# Patient Record
Sex: Male | Born: 1955 | Race: Black or African American | Hispanic: No | Marital: Married | Smoking: Former smoker
Health system: Southern US, Community
[De-identification: ages and names within clinical notes are randomized; demographics above are authoritative.]

## PROBLEM LIST (undated history)

## (undated) DIAGNOSIS — I209 Angina pectoris, unspecified: Secondary | ICD-10-CM

## (undated) DIAGNOSIS — F329 Major depressive disorder, single episode, unspecified: Secondary | ICD-10-CM

## (undated) DIAGNOSIS — I10 Essential (primary) hypertension: Secondary | ICD-10-CM

## (undated) DIAGNOSIS — F32A Depression, unspecified: Secondary | ICD-10-CM

## (undated) DIAGNOSIS — I509 Heart failure, unspecified: Secondary | ICD-10-CM

## (undated) DIAGNOSIS — I219 Acute myocardial infarction, unspecified: Secondary | ICD-10-CM

## (undated) DIAGNOSIS — I2510 Atherosclerotic heart disease of native coronary artery without angina pectoris: Secondary | ICD-10-CM

## (undated) HISTORY — DX: Heart failure, unspecified: I50.9

## (undated) HISTORY — PX: CORONARY ARTERY BYPASS GRAFT: SHX141

---

## 2011-12-12 ENCOUNTER — Inpatient Hospital Stay: Payer: Self-pay | Admitting: Internal Medicine

## 2011-12-12 DIAGNOSIS — Z9889 Other specified postprocedural states: Secondary | ICD-10-CM | POA: Insufficient documentation

## 2011-12-12 HISTORY — DX: Other specified postprocedural states: Z98.890

## 2011-12-12 LAB — CBC
HCT: 40.9 % (ref 40.0–52.0)
HCT: 42.3 % (ref 40.0–52.0)
MCH: 32.4 pg (ref 26.0–34.0)
MCHC: 33.4 g/dL (ref 32.0–36.0)
MCHC: 33.6 g/dL (ref 32.0–36.0)
MCV: 97 fL (ref 80–100)
Platelet: 270 10*3/uL (ref 150–440)
RBC: 4.21 10*6/uL — ABNORMAL LOW (ref 4.40–5.90)
RDW: 12.7 % (ref 11.5–14.5)
WBC: 7.3 10*3/uL (ref 3.8–10.6)

## 2011-12-12 LAB — TROPONIN I
Troponin-I: 0.53 ng/mL — ABNORMAL HIGH
Troponin-I: 1.6 ng/mL — ABNORMAL HIGH
Troponin-I: 0.5 ng/mL — ABNORMAL HIGH

## 2011-12-12 LAB — BASIC METABOLIC PANEL
Anion Gap: 4 — ABNORMAL LOW (ref 7–16)
Calcium, Total: 9.1 mg/dL (ref 8.5–10.1)
Co2: 29 mmol/L (ref 21–32)
Creatinine: 1.14 mg/dL (ref 0.60–1.30)
EGFR (Non-African Amer.): >60
Osmolality: 276 (ref 275–301)
Potassium: 4.2 mmol/L (ref 3.5–5.1)
Sodium: 139 mmol/L (ref 136–145)

## 2011-12-12 LAB — COMPREHENSIVE METABOLIC PANEL
Albumin: 3.7 g/dL (ref 3.4–5.0)
Alkaline Phosphatase: 71 U/L (ref 50–136)
Bilirubin,Total: 0.7 mg/dL (ref 0.2–1.0)
Calcium, Total: 9 mg/dL (ref 8.5–10.1)
Creatinine: 1.15 mg/dL (ref 0.60–1.30)
Glucose: 113 mg/dL — ABNORMAL HIGH (ref 65–99)
Osmolality: 284 (ref 275–301)
Potassium: 3.9 mmol/L (ref 3.5–5.1)
SGOT(AST): 31 U/L (ref 15–37)
Sodium: 142 mmol/L (ref 136–145)
Total Protein: 7.5 g/dL (ref 6.4–8.2)

## 2011-12-12 LAB — CK TOTAL AND CKMB (NOT AT ARMC)
CK, Total: 299 U/L — ABNORMAL HIGH (ref 35–232)
CK-MB: 4.7 ng/mL — ABNORMAL HIGH (ref 0.5–3.6)

## 2011-12-12 LAB — PROTIME-INR: Prothrombin Time: 13.4 secs (ref 11.5–14.7)

## 2011-12-12 LAB — APTT: Activated PTT: 36 secs — ABNORMAL HIGH (ref 23.6–35.9)

## 2011-12-13 LAB — LIPID PANEL
Ldl Cholesterol, Calc: 104 mg/dL — ABNORMAL HIGH (ref 0–100)
Triglycerides: 115 mg/dL (ref 0–200)
VLDL Cholesterol, Calc: 23 mg/dL (ref 5–40)

## 2011-12-21 ENCOUNTER — Inpatient Hospital Stay: Payer: Self-pay | Admitting: Internal Medicine

## 2011-12-21 LAB — BASIC METABOLIC PANEL
Anion Gap: 8 (ref 7–16)
BUN: 14 mg/dL (ref 7–18)
Calcium, Total: 8.6 mg/dL (ref 8.5–10.1)
Chloride: 107 mmol/L (ref 98–107)
Co2: 25 mmol/L (ref 21–32)
Creatinine: 1.05 mg/dL (ref 0.60–1.30)
EGFR (African American): >60
EGFR (Non-African Amer.): >60
Osmolality: 280 (ref 275–301)
Sodium: 140 mmol/L (ref 136–145)

## 2011-12-21 LAB — PROTIME-INR
INR: 1
Prothrombin Time: 13.4 secs (ref 11.5–14.7)

## 2011-12-21 LAB — CBC
MCH: 33.4 pg (ref 26.0–34.0)
MCV: 97 fL (ref 80–100)
Platelet: 325 10*3/uL (ref 150–440)
RBC: 3.33 10*6/uL — ABNORMAL LOW (ref 4.40–5.90)
RDW: 13.2 % (ref 11.5–14.5)
WBC: 7.9 10*3/uL (ref 3.8–10.6)

## 2011-12-21 LAB — CK TOTAL AND CKMB (NOT AT ARMC)
CK, Total: 242 U/L — ABNORMAL HIGH (ref 35–232)
CK-MB: 2 ng/mL (ref 0.5–3.6)

## 2011-12-21 LAB — TROPONIN I: Troponin-I: 0.48 ng/mL — ABNORMAL HIGH

## 2011-12-25 DIAGNOSIS — Z951 Presence of aortocoronary bypass graft: Secondary | ICD-10-CM | POA: Insufficient documentation

## 2011-12-25 HISTORY — DX: Presence of aortocoronary bypass graft: Z95.1

## 2013-02-18 IMAGING — CR DG CHEST 1V PORT
1 series · 1 of 1 positions shown · non-contrast
Comparison: none

REASON FOR EXAM: Chest Pain
COMMENTS:

[ap]
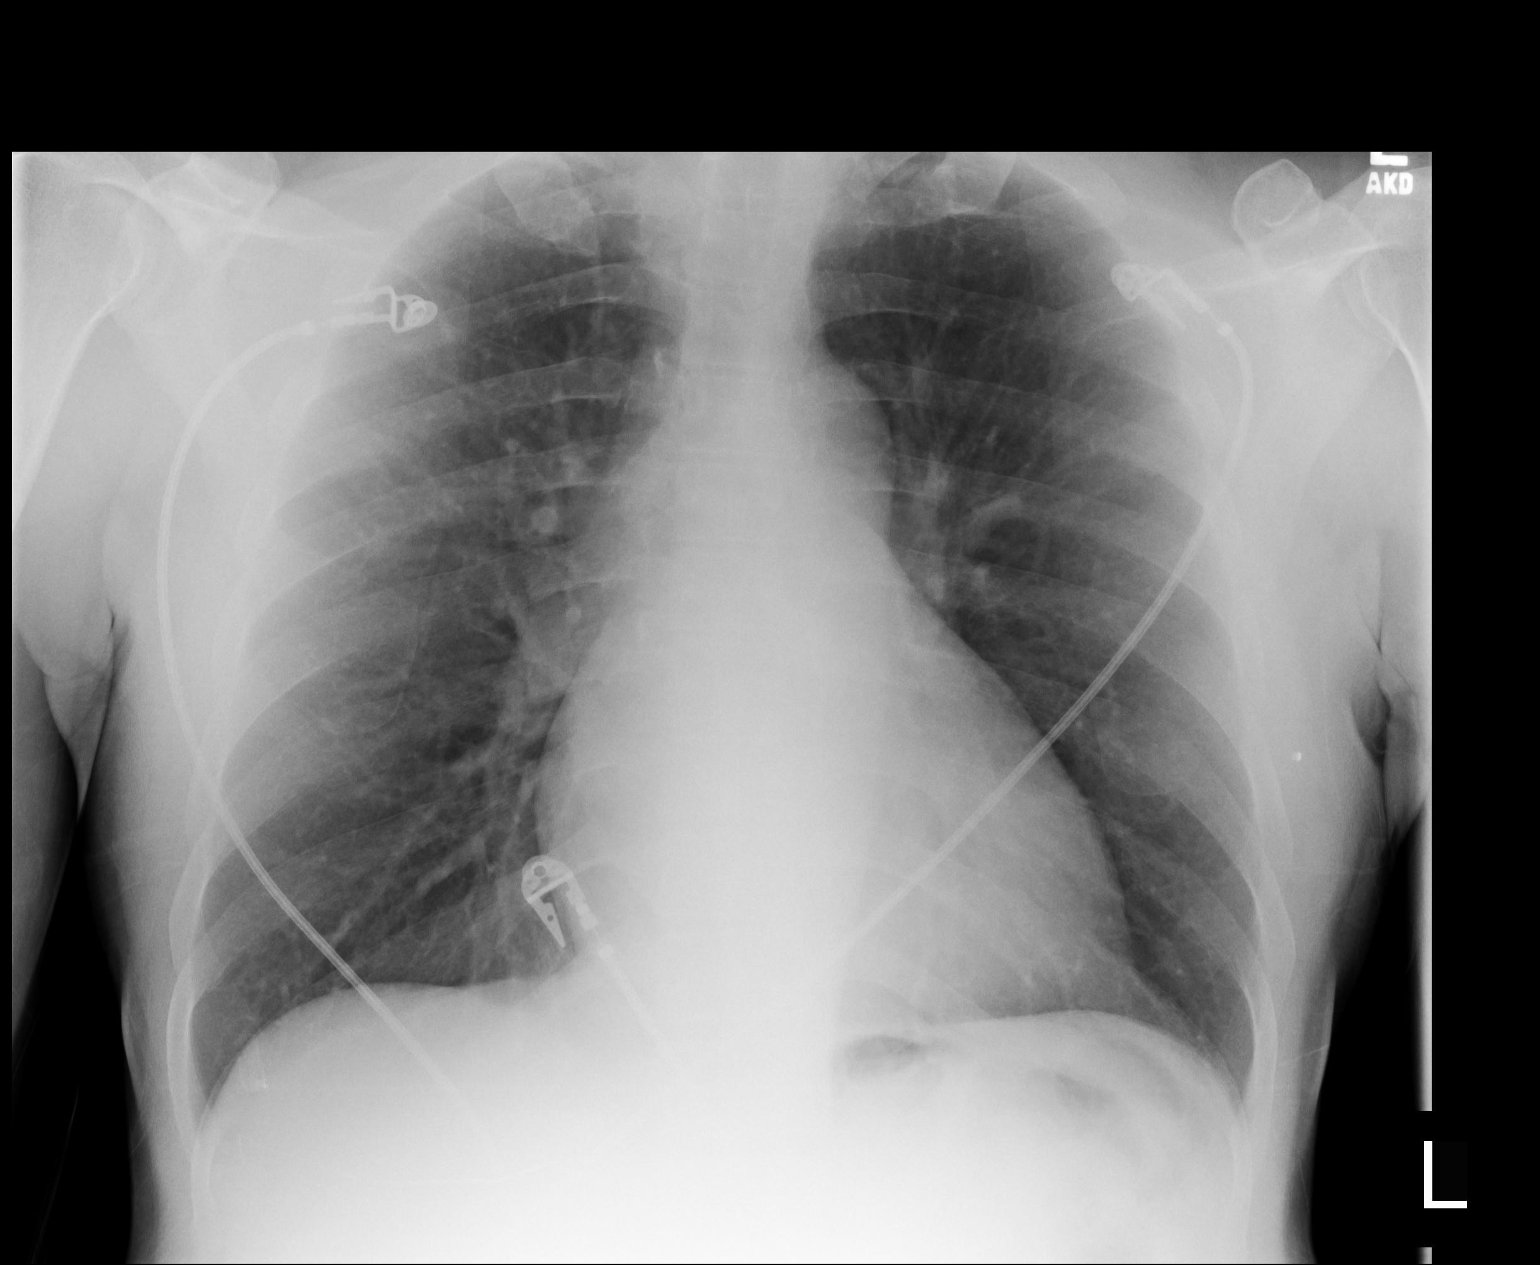

[1 of 1 positions shown; findings below may reference images not displayed]

PROCEDURE:     DXR - DXR PORTABLE CHEST SINGLE VIEW  - December 21, 2011  [DATE]

RESULT:     Comparison is made to the study 12 December, 2011.

The cardiac silhouette is mildly enlarged. There is mild hyperinflation of
the lungs. Cardiac monitoring electrodes are present. There is no edema,
infiltrate, effusion or mass. No pneumothorax is evident.
IMPRESSION: 1. Lordotic projection. No acute cardiopulmonary disease appreciated.

[REDACTED]

## 2013-11-05 DIAGNOSIS — I219 Acute myocardial infarction, unspecified: Secondary | ICD-10-CM

## 2013-11-05 DIAGNOSIS — E785 Hyperlipidemia, unspecified: Secondary | ICD-10-CM | POA: Insufficient documentation

## 2013-11-05 HISTORY — DX: Acute myocardial infarction, unspecified: I21.9

## 2014-02-23 ENCOUNTER — Ambulatory Visit: Payer: Self-pay | Admitting: Urology

## 2014-04-20 ENCOUNTER — Ambulatory Visit: Payer: Self-pay | Admitting: Urology

## 2014-04-20 DIAGNOSIS — I25810 Atherosclerosis of coronary artery bypass graft(s) without angina pectoris: Secondary | ICD-10-CM

## 2014-04-20 LAB — BASIC METABOLIC PANEL
ANION GAP: 2 — AB (ref 7–16)
BUN: 12 mg/dL (ref 7–18)
CALCIUM: 9.1 mg/dL (ref 8.5–10.1)
CO2: 33 mmol/L — AB (ref 21–32)
Chloride: 104 mmol/L (ref 98–107)
Creatinine: 1.26 mg/dL (ref 0.60–1.30)
EGFR (African American): >60
EGFR (Non-African Amer.): >60
Glucose: 82 mg/dL (ref 65–99)
Osmolality: 276 (ref 275–301)
POTASSIUM: 4.6 mmol/L (ref 3.5–5.1)
Sodium: 139 mmol/L (ref 136–145)

## 2014-04-20 LAB — CBC
HCT: 41.9 % (ref 40.0–52.0)
HGB: 13.8 g/dL (ref 13.0–18.0)
MCH: 31.8 pg (ref 26.0–34.0)
MCHC: 32.8 g/dL (ref 32.0–36.0)
MCV: 97 fL (ref 80–100)
Platelet: 282 10*3/uL (ref 150–440)
RBC: 4.32 10*6/uL — AB (ref 4.40–5.90)
RDW: 13.3 % (ref 11.5–14.5)
WBC: 5.3 10*3/uL (ref 3.8–10.6)

## 2014-05-05 ENCOUNTER — Ambulatory Visit: Payer: Self-pay | Admitting: Urology

## 2014-12-02 NOTE — Op Note (Signed)
PATIENT NAME:  Ryan Stafford, Ryan Stafford MR#:  [id number] DATE OF BIRTH:  1956-06-16  DATE OF PROCEDURE:  05/05/2014  PREOPERATIVE DIAGNOSIS: Right hydrocele.   POSTOPERATIVE DIAGNOSES:  1. Right hydrocele.  2. No left hydrocele.  3. Scrotal sebaceous cyst.  PROCEDURE: Right hydrocelectomy, excision of a sebaceous cyst of the scrotum.   ANESTHESIA: General.  COMPLICATIONS: None.  DESCRIPTION OF PROCEDURE: With the patient sterilely prepped and draped in the supine position for ease of approach to the external genitalia and under good relaxation from a general anesthetic, we did a timeout. The timeout was agreed to. Examination revealed almost no left hydrocele at all and I do not really want to excise the left tunica vaginalis. I excised the right tunica vaginalis through a midline incision after placing 3 to 4 mL of lidocaine and Marcaine solution into the incision site at the median raphe. I carried the incision down through the tunica vaginalis with electrocautery, so there was no bleeding. Then, the hydrocele was opened, incised and marsupialized on itself and sutured with a running 3-0 Vicryl. The skin was closed with a running 3-0 Vicryl. This was subcuticular continuous with the tunica vaginalis closure. No drain was utilized as there was minimal bleeding. Once I closed the skin, I excised a small sebaceous cyst on the right hemiscrotum utilizing an incision over the cyst, going down to the capsule and cutting the capsule away, suturing any bleeding at the base of the sebaceous cyst. Then, the skin was closed with a 3-0 Vicryl. The 3-0 Vicryl was also used on the base of the sebaceous cyst. The capsule was included in the excision. Sterile dressings were placed. He was sent to recovery in satisfactory condition with Dermabond over the wound site.      ____________________________ Caralyn Guileichard D. Edwyna ShellHart, DO rdh:TT D: 05/05/2014 16:10:31 ET T: 05/05/2014 18:00:29 ET JOB#: [id number]  cc: Caralyn Guileichard D. Edwyna ShellHart,  DO, <Dictator> RICHARD D HART DO ELECTRONICALLY SIGNED 06/02/2014 14:08

## 2014-12-03 NOTE — H&P (Signed)
PATIENT NAME:  Ryan Stafford, Ryan Stafford MR#:  [id number] DATE OF BIRTH:  [birth year]  DATE OF ADMISSION:  12/21/2011  ADDENDUM  ALLERGIES: None.   MEDICATIONS:  1. Vicodin 5 mg/500 mg p.o. t.i.d. p.r.n.  2. Diovan 80 mg p.o. daily.  3. Toprol-XL 50 mg p.o. once daily.  4. Aspirin 325 mg p.o. daily.  5. Imdur 30 mg daily.   6. Lipitor 40 mg p.o. daily.   ASSESSMENT AND PLAN: For tobacco use, patient was counseled for smoking cessation.   ____________________________ Ryan PollackQing Curt Oatis, MD qc:cms D: 12/21/2011 12:01:38 ET T: 12/21/2011 13:06:30 ET JOB#: [id number]  cc: Ryan PollackQing Geanine Vandekamp, MD, <Dictator> Ryan PollackQING Lajuan Godbee MD ELECTRONICALLY SIGNED 12/21/2011 16:50

## 2014-12-03 NOTE — H&P (Signed)
PATIENT NAME:  Ryan Stafford, Ryan Stafford MR#:  [id number] DATE OF BIRTH:  May 09, 1956  DATE OF ADMISSION:  12/21/2011  CHIEF COMPLAINT: Chest pain.   HISTORY OF PRESENT ILLNESS: The patient is a 59 year old African American male with a history of hypertension, coronary artery disease, recently diagnosed myocardial infarction, and hyperlipidemia who presented to the ED with chest pain which happened last night and early this morning. The patient denies any radiation or diaphoresis. No shortness of breath, orthopnea, or nocturnal dyspnea. No leg edema. No chest pain now. The patient denies any other symptoms. He was noted to have elevated troponin at 0.48 and was treated with Lovenox and aspirin.  Actually the patient was just discharged a few days ago for non-STEMI.  During the last hospitalization cardiologist Dr. Darrold Junker did a cardiac catheterization which showed multiple vessel stenoses. He recommended medical management and followup with him as an outpatient.  The patient may need surgery.   PAST MEDICAL HISTORY:  1. Hypertension.  2. Hyperlipidemia.  3. Coronary artery disease. 4. Myocardial infarction.   SOCIAL HISTORY:  Chronic smoker of one pack a day since the age of 25. No alcohol or drug abuse.   FAMILY HISTORY: Negative for coronary artery disease.  PAST SURGICAL HISTORY: None.  REVIEW OF SYSTEMS: CONSTITUTIONAL: The patient denies any fever or chills. No headache or dizziness. No weight loss.  HEENT: No double vision, blurred vision, or glaucoma. No postnasal drip or epistaxis. No dysphagia or slurred speech. RESPIRATORY: No cough, sputum, shortness of breath, or hemoptysis.  CARDIOVASCULAR: Positive for chest pain, but no orthopnea, nocturnal dyspnea, or leg edema. No palpitations. GI: No abdominal pain, nausea, vomiting, or diarrhea. No melena. No bloody stool. GU: No dysuria, hematuria, or incontinence.  ENDOCRINE:  No polydipsia or polyuria. HEME: No easy bruising or bleeding. PSYCHIATRIC:  No depression or anxiety. NEUROLOGY: No syncope, loss of consciousness, or seizure. MUSCULOSKELETAL: No joint pain or edema. SKIN: No rash or jaundice.   PHYSICAL EXAMINATION:  VITAL SIGNS: Temperature 98.8, blood pressure 135/79, blood pressure was 175/95. Pulse 71, oxygen saturation 98% on room air.   GENERAL: The patient is alert, awake, oriented, in no acute distress.   HEENT: Pupils round, equal, reactive to light and accommodation. Moist oral mucosa. Clear oropharynx.   NECK: Supple. No JVD or carotid bruit. No lymphadenopathy. No thyromegaly.   CARDIOVASCULAR: S1, S2, regular rate and rhythm. No murmurs or gallops.   PULMONARY: Bilateral air entry. No wheezing or rales.   ABDOMEN: Soft. No distention. No tenderness. No organomegaly. Bowel sounds present.   EXTREMITIES: No edema, clubbing, or cyanosis. No calf tenderness. Strong bilateral pedal pulses.   SKIN: No rash or jaundice.   NEUROLOGY: Alert and oriented times three. No focal deficits. Power five out of five. Sensation intact. Deep tendon reflexes 2+.   LABS/STUDIES: Troponin 0.48, WBC 7.9, hemoglobin 11.1, platelets 325. Electrolytes are normal. Glucose 102, BUN 14, creatinine 1.05, CK 242, CK-MB 2, INR 1.  Chest x-ray: No acute cardiopulmonary disease. EKG shows sinus bradycardia at 55 beats per minute with possible left ventricular hypertrophy.  IMPRESSION: 1. Non-STEMI. 2. Coronary artery disease with multivessel stenosis.  3. Hypertension.  4. Hyperlipidemia.  5. Anemia.  6. Tobacco use.   PLAN OF TREATMENT:  1. The patient will be admitted to the telemetry floor. We will start O2 by nasal cannula. Continue telemetry monitoring. Follow up troponin level.  2. We will continue aspirin 325 mg p.o. daily, nitroglycerin and morphine p.r.n., give Lovenox 85 mg subcutaneous  q.12 hours and follow up with cardiology, Dr. Darrold JunkerParaschos.   3. We will continue hypertension medications including Lopressor, Diovan, Imdur, and  also Lipitor.  4. GI prophylaxis.  5. I discussed the patient's situation and plan of treatment with the patient and patient's family member.   TIME SPENT: About 60 minutes.   ____________________________ Shaune PollackQing Shalee Paolo, MD qc:bjt D: 12/21/2011 11:54:00 ET T: 12/21/2011 13:51:40 ET JOB#: [id number]  cc: Shaune PollackQing Majestic Molony, MD, <Dictator> Shaune PollackQING Hubbard Seldon MD ELECTRONICALLY SIGNED 12/21/2011 16:50

## 2014-12-03 NOTE — H&P (Signed)
PATIENT NAME:  Ryan Stafford, Ryan Stafford MR#:  [id number] DATE OF BIRTH:  Feb 18, 1956  DATE OF ADMISSION:  12/12/2011  CHIEF COMPLAINT: Chest pain.   HISTORY OF PRESENT ILLNESS: Ryan Stafford is a 59 year old pleasant African American male with past medical history of systemic hypertension. The patient was in his usual state of health until about four days ago when he developed midsternal chest pain, that was on Tuesday, located at the midsternal area. The severity was 6 on a scale of 10. Lasted for about maximum five minutes then subsided. The pain recurred again last evening around 7:00 p.m. and then eased off to recur again at 11:00 p.m. This time was severe, reaching 9 to 10 on a scale of 10. Midsternal in location, described as heaviness-like feeling with some inside feeling of sticking sensation. This lasted about 20 minutes and he was on his way to the Emergency Department. Just a few minutes before arrival his chest pain started to ease off. He denied having any shortness of breath. No vomiting but he reported sweating. His chest pain right now is 0 on a scale of 10.   REVIEW OF SYSTEMS: CONSTITUTIONAL: Denies having any fever. No chills. No fatigue. EYES: No blurring of vision. No double vision. ENT: No hearing impairment. No sore throat. No dysphagia. CARDIOVASCULAR: Reports chest pain as above. No shortness of breath. No syncope. RESPIRATORY: No shortness of breath. No chest pain. No cough. No sputum production. GASTROINTESTINAL: No abdominal pain. No vomiting. No diarrhea. GENITOURINARY: No dysuria or frequency of urination. MUSCULOSKELETAL: No joint pain or swelling. No muscular pain or swelling. INTEGUMENTARY: No skin rash. No ulcers. NEUROLOGY: No focal weakness. No seizure activity. No headache. PSYCHIATRY: No anxiety. No depression. ENDOCRINE: No heat or cold intolerance. No polyuria or polydipsia.   PAST MEDICAL HISTORY:  1. Systemic hypertension. 2. Mild hypercholesterolemia.   SOCIAL HABITS:  Chronic smoker, 1 pack per day since age of 40. No history of alcoholism but he drinks maybe once a month a beer or liquor. No history of drug abuse.   FAMILY HISTORY: Negative for premature coronary artery disease.   SOCIAL HISTORY: He is married, living with his wife. He works as Merchandiser, retail at [employer].   ADMISSION MEDICATION: Diovan, the dose is not known.   ALLERGIES: No known drug allergies.   PHYSICAL EXAMINATION:  VITAL SIGNS: Blood pressure 181/109, respiratory rate 20, pulse 75, temperature 96.7, oxygen saturation 98%.   GENERAL APPEARANCE: Middle-aged male laying in bed in no acute distress.   HEAD AND NECK EXAMINATION: No pallor. No icterus. No cyanosis.   ENT: Hearing was normal. Nasal mucosa, lips, tongue were normal.   EYES: Normal eyelids and conjunctiva. Pupils about 4 mm, equal and reactive to light.   NECK: Supple. Trachea at midline. No thyromegaly. No cervical lymphadenopathy. No masses.   HEART: Normal S1, S2. No S3 or S4. No murmur. No gallop. No carotid bruits.   RESPIRATORY: Normal breathing pattern without use of accessory muscles. No rales. No wheezing. There were few scattered rhonchi.   ABDOMEN: Soft without tenderness. No hepatosplenomegaly. No masses. No hernias.   SKIN: No ulcers. No subcutaneous nodules.   MUSCULOSKELETAL: No joint swelling. No clubbing.   NEUROLOGIC: Cranial nerves II through XII are intact. No focal motor deficit.   PSYCHIATRIC: Patient is alert, oriented x3. Mood and affect were normal.   LABORATORY, DIAGNOSTIC, AND RADIOLOGICAL DATA: EKG showed normal sinus rhythm at rate of 68 per minute. EKG again showed normal sinus rhythm at rate  of 68 per minute. Mild elevation of ST segment in the anterior leads with biphasic T wave. Otherwise unremarkable EKG. Serum glucose 113, BUN 12, creatinine 1.15, sodium 142, potassium 3.9. Liver function tests were normal. Total CPK was elevated 299. Troponin was elevated at 0.53. CBC  showed white count 7000, hemoglobin 14, hematocrit 42, platelet count 270. Prothrombin time 13. INR 1. APTT 36.   ASSESSMENT:  1. Non-ST elevation acute myocardial infarction.  2. Severe systemic hypertension.  3. Mild hypercholesterolemia.  4. Tobacco abuse.   PLAN: Admit the patient to the Intensive Care Unit in consultation with cardiology. Aspirin initiated at 325 mg a day, beta blocker using metoprolol 25 mg twice a day. Continue Diovan 80 mg a day. Nitroglycerin patch and sublingual p.r.n. as well. Full anticoagulation with Lovenox using 85 mg subcutaneous twice a day. Check lipid profile in the morning. Follow up on cardiac enzymes. The patient is now comfortable and chest pain free. Patient is advised to quit smoking and I placed him on nicotine patch.   TIME SPENT EVALUATING THIS PATIENT: Took more than 55 minutes.   ____________________________ Ryan CornersAmir M. Rudene Rearwish, MD amd:cms D: 12/12/2011 02:37:26 ET T: 12/12/2011 07:17:13 ET JOB#: [id number]  cc: Ryan CornersAmir M. Rudene Rearwish, MD, <Dictator> Zollie ScaleAMIR M Lorraine Cimmino MD ELECTRONICALLY SIGNED 12/12/2011 22:12

## 2014-12-03 NOTE — Discharge Summary (Signed)
PATIENT NAME:  Ryan Stafford, Ryan Stafford MR#:  [id number] DATE OF BIRTH:  [birth year]  DATE OF ADMISSION:  12/21/2011 DATE OF DISCHARGE:  12/22/2011  PRIMARY CARE PHYSICIAN: Evelene CroonMeindert Niemeyer, MD  CONSULTANTS:  Marcina MillardAlexander Paraschos, MD - Cardiology.  DISCHARGE INSTRUCTIONS: [facility].   HISTORY OF PRESENT ILLNESS: The patient is 59 year old African American male with a history of hypertension, coronary artery disease, recently diagnosed myocardial infarction, and hyperlipidemia who presented to the ED with chest pain twice without radiation or diaphoresis. The patient was noted to have an elevated troponin and was treated with Lovenox and aspirin and admitted for non-STEMI. For a detailed history and physical examination, please refer to the admission note dictated by Dr. Imogene Burnhen.   LABS/STUDIES: On admission date troponin was 0.48. WBC was 7.9 and hemoglobin 11.1. BUN was 14 and creatinine 1.05. Electrolytes are normal. CK 242 and CK-MB 2. INR 1.   Chest x-ray: No acute cardiopulmonary disease.   EKG showed sinus bradycardia at 55 beats per minute with left ventricular hypertrophy.   HOSPITAL COURSE: After admission the patient has been treated with aspirin 325 mg p.o. daily and nitroglycerin p.r.n. In addition, the patient has been treated with Lovenox 1 mg/kg every 12 hours subcutaneous. According to nurse, cardiology, Dr. Darrold JunkerParaschos, evaluated the patient today and suggests the [facility] needs [facility]. The patient's blood pressure has been controlled with Lopressor and Imdur. The patient has had no complaints after admission.   Today his temperature is 97.6, blood pressure 142/79, pulse 55, and oxygen saturation 96% on room air. Physical examination is unremarkable. The patient is stable and [facility].    FINAL DIAGNOSES:  1. Non-ST-elevated myocardial infarction. 2. Coronary artery disease with multivessel  stenoses.  3. Hyperlipidemia.  4. Hypertension.  5. Tobacco use.  6. Anemia.   DISPOSITION: [facility]. I discussed the patient's transfer plan with the nurse and the patient.   TIME SPENT: Approximately 35 minutes. ____________________________ Shaune PollackQing Torra Pala, MD qc:slb D: 12/22/2011 14:18:37 ET T: 12/22/2011 14:46:24 ET JOB#: [id number]  cc: Shaune PollackQing Conley Delisle, MD, <Dictator> Meindert A. Lacie ScottsNiemeyer, MD Shaune PollackQING Otoniel Myhand MD ELECTRONICALLY SIGNED 12/22/2011 16:00

## 2014-12-03 NOTE — Consult Note (Signed)
PATIENT NAME:  Ryan Stafford, [name] MR#:  [id number] DATE OF BIRTH:  05-22-1956  DATE OF CONSULTATION:  12/22/2011  REFERRING PHYSICIAN:  Shaune PollackQing Chen, MD CONSULTING PHYSICIAN:  Marcina MillardAlexander Cicley Ganesh, MD  CHIEF COMPLAINT: Chest pain.   HISTORY OF PRESENT ILLNESS: The patient is a 59 year old gentleman referred for evaluation of chest pain and non-ST elevation myocardial infarction. The patient underwent cardiac catheterization last week which revealed 70% stenosis of the mid LAD, 80% stenosis of the distal LAD, 80% stenosis of the first diagonal branch, 75% stenosis of the proximal ramus intermedius branch, 80% stenosis of the first obtuse marginal branch, 75% stenosis of the third obtuse marginal branch, and 75% stenosis in the left posterolateral branch. Since the patient had diffuse disease, it was felt that initial medical therapy would be reasonable. The patient was discharged home only to experience recurrent chest pain and was readmitted on 12/21/2011. Troponin was elevated to 0.48. There were no diagnostic ECG changes.   PAST MEDICAL HISTORY:  1. Three-vessel coronary artery disease as described above.  2. Hypertension.  3. Hyperlipidemia.  4. Non-ST elevation myocardial infarction 12/11/2011.   MEDICATIONS:  1. Aspirin 325 mg daily.  2. Toprol-XL 50 mg daily.  3. Imdur 30 mg daily.  4. Valsartan 80 mg daily.  5. Lipitor 40 mg at bedtime. 6. Vicodin p.r.n.     SOCIAL HISTORY: The patient is married. He quit tobacco abuse two weeks ago.   FAMILY HISTORY: No immediate family history of coronary artery disease or myocardial infarction.    REVIEW OF SYSTEMS: CONSTITUTIONAL: No fever or chills. EYES: No blurry vision. EARS: No hearing loss. RESPIRATORY: No shortness of breath. CARDIOVASCULAR: Chest pain as described above. GASTROINTESTINAL: No nausea, vomiting, diarrhea or constipation. GU: No dysuria or hematuria. MUSCULOSKELETAL: No arthralgias or myalgias. NEUROLOGICAL: No focal muscle weakness  or numbness. PSYCHOLOGICAL: No depression or anxiety.   PHYSICAL EXAMINATION:  VITAL SIGNS: Blood pressure 142/79, pulse 55, respirations 18, temperature 97.9, pulse oximetry 96%.   HEENT: Pupils are equal and reactive to light and accommodation.   NECK: Supple without thyromegaly.   LUNGS: Clear.   HEART: Normal jugular venous pressure. Normal point of maximal impulse. Regular rate and rhythm. Normal S1, S2. No appreciable gallop, murmur, or rub.   ABDOMEN: Soft and nontender. Pulses were intact bilaterally.   MUSCULOSKELETAL: Normal muscle tone.   NEUROLOGICAL: The patient is alert and oriented x3. Motor and sensory are both grossly intact.   IMPRESSION: A 59 year old gentleman with known coronary artery disease who has failed medical therapy, now is admitted with recurrent chest pain with elevated troponin consistent with non-ST elevation myocardial infarction.   RECOMMENDATIONS:  1. Agree with overall current therapy.  2. [facility] artery bypass graft [facility].  ____________________________ Marcina MillardAlexander Kerigan Narvaez, MD ap:cbb D: 12/22/2011 13:01:00 ET T: 12/22/2011 17:48:07 ET JOB#: [id number] Lyn HollingsheadALEXANDER Guadalupe Kerekes MD ELECTRONICALLY SIGNED 01/22/2012 17:39

## 2014-12-03 NOTE — Discharge Summary (Signed)
PATIENT NAME:  Ryan Stafford, Ryan Stafford MR#:  [id number] DATE OF BIRTH:  [birth year]  DATE OF ADMISSION:  12/12/2011 DATE OF DISCHARGE:  12/13/2011  PRESENTING COMPLAINT: Chest pain.   DISCHARGE DIAGNOSES:  1. Acute non-Q-wave myocardial infarction, status post cardiac catheterization with multi-vessel coronary artery disease, medical treatment at present.  2. Hypertension.  3. Hyperlipidemia.  4. Tobacco abuse.   CONDITION ON DISCHARGE: Fair.   MEDICATIONS:  1. Aspirin 325 mg p.o. daily.  2. Lipitor 40 mg daily.  3. Toprol-XL 50 mg daily. 4. Imdur SA 30 mg daily.  5. Losartan 80 mg p.o. daily.  6. Nitroglycerin 0.4 mg sublingual p.r.n. for chest pain.  7. Vicodin 5/500, 1 p.o. t.i.d. p.r.n.   DIET: Low sodium diet.   DISCHARGE INSTRUCTIONS:  1. The patient was advised smoking cessation.  2. Follow up with Dr. Darrold JunkerParaschos in one week.  3. Follow up with primary care physician in 1 to 2 weeks.   LABORATORY, DIAGNOSTIC AND RADIOLOGICAL DATA:  Cholesterol 166, triglycerides 115, LDL is 104. Creatinine is 1.1. Troponin was 0.50, 1.56, 1.60 and 0.53.  Hemoglobin and hematocrit is 13.6 and 40.9. White count is 7.3.  Cardiac catheterization showed global ejection fraction of 36%. Three-vessel coronary artery disease with 70% proximal LAD,  75% stenosis of ramus, 75% stenosis of obtuse marginal 1, 75% stenosis PL2, sequential 75% stenosis nondominant RCA. Likely culprit lesion is diffuse 80% stenosis of the apical segment.  Echo Doppler showed mildly reduced LVEF, estimated ejection fraction of 45%, left ventricular hypertrophy and mild TR.  PT-INR within normal limits.  LFTs within normal limits.  EKG showed normal sinus rhythm, ST-T wave abnormality considered likely anterior ischemia.   CONSULTATION: Cardiology consultation with Dr. Darrold JunkerParaschos.   BRIEF SUMMARY OF HOSPITAL COURSE: Ryan Stafford is a 59 year old African American gentleman with history of hypertension and tobacco abuse, comes to the  Emergency Room with:  1. Acute non-Q-wave myocardial infarction: The patient was started on lovenox subcutaneous b.i.d., beta blockers, aspirin. ACE inhibitors were given, and Lipitor was started as well. He was seen by Dr. Darrold JunkerParaschos, who performed cardiac catheterization. Results as above were noted. The patient did tolerate the procedure well, postoperatively was continued on aspirin, beta blockers, Diovan and Lipitor. Imdur was also added. The patient is going to be treated with medical treatment for now. The patient will follow up with Dr. Darrold JunkerParaschos as an outpatient very closely and likely down the road may need coronary artery bypass graft.  2. Hypertension: Stable.  3. Tobacco abuse: The patient was advised cessation. He did decide on smoking cessation.   The hospital stay otherwise remained stable.   TIME SPENT: 40 minutes.   ____________________________ Wylie HailSona A. Allena KatzPatel, MD sap:cbb D: 12/13/2011 12:58:57 ET T: 12/13/2011 13:09:24 ET JOB#: [id number]  cc: Theophil Thivierge A. Allena KatzPatel, MD, <Dictator> Willow OraSONA A Kloe Oates MD ELECTRONICALLY SIGNED 12/21/2011 13:55

## 2014-12-03 NOTE — Consult Note (Signed)
PATIENT NAME:  Ryan Stafford, Ryan Stafford MR#:  [id number] DATE OF BIRTH:  [birth year]  DATE OF CONSULTATION:  12/12/2011  REFERRING PHYSICIAN:  Marlaine HindAmir Darwish, MD  CONSULTING PHYSICIAN:  Marcina MillardAlexander Mecca Guitron, MD  PRIMARY CARE PHYSICIAN: Evelene CroonMeindert Niemeyer, MD   CHIEF COMPLAINT: Chest pain.   HISTORY OF PRESENT ILLNESS: The patient is a 59 year old gentleman referred for evaluation of chest pain and elevated troponin. The patient reports a one week history of intermittent episodes of chest discomfort. The patient presented on 12/11/2011 at 4 a.m. after prolonged episode of chest discomfort. He described it as 9 out of 10. The patient was admitted to the CCU where he has remained chest pain free. Troponin is elevated at 1.6. CPK-MB was also elevated at 4.7.   PAST MEDICAL HISTORY:  1. Hypertension.  2. Tobacco abuse.   MEDICATIONS: Diovan.   SOCIAL HISTORY: The patient is married. He continues to smoke a pack of cigarettes a day.   FAMILY HISTORY: No immediate family history for myocardial infarction or coronary artery disease.   REVIEW OF SYSTEMS: CONSTITUTIONAL: No fever or chills. EYES: No blurry vision. EARS: No hearing loss. RESPIRATORY: No shortness of breath. CARDIOVASCULAR: Chest pain as described above. GI: No nausea, vomiting, diarrhea, or constipation. GU: No dysuria or hematuria. ENDOCRINE: No polyuria or polydipsia. MUSCULOSKELETAL: No arthralgias or myalgias. NEUROLOGICAL: No focal muscle weakness or numbness. PSYCHOLOGICAL: No depression or anxiety.   PHYSICAL EXAMINATION:   VITAL SIGNS: Blood pressure 130/85, pulse 64, respirations 20.   HEENT: Pupils equal, reactive to light and accommodation.   NECK: Supple without thyromegaly.   LUNGS: Clear.   HEART: Normal JVP. Normal PMI. Regular rate and rhythm. Normal S1, S2. No appreciable gallop, murmur, or rub.   ABDOMEN: Soft, nontender. Pulses were intact bilaterally.   MUSCULOSKELETAL: Normal muscle tone.   NEUROLOGIC: The patient  is alert and oriented x3. Motor and sensory both grossly intact.   IMPRESSION: This is a 59 year old gentleman with new onset chest pain, has ruled in for non-ST elevation myocardial infarction.   RECOMMENDATIONS:  1. Continue current meds.  2. Proceed with cardiac catheterization with selective coronary arteriography. Risks, benefits, and alternatives were explained and informed written consent obtained.  ____________________________ Marcina MillardAlexander Kaesyn Johnston, MD ap:drc D: 12/12/2011 13:10:46 ET T: 12/12/2011 13:28:01 ET JOB#: [id number]  cc: Marcina MillardAlexander Eryk Beavers, MD, <Dictator> Marcina MillardALEXANDER Leemon Ayala MD ELECTRONICALLY SIGNED 12/14/2011 9:39

## 2014-12-03 NOTE — Discharge Summary (Signed)
PATIENT NAME:  Ryan Stafford, Ryan Stafford MR#:  [id number] DATE OF BIRTH:  1955/11/28  DATE OF ADMISSION:  12/21/2011 DATE OF DISCHARGE:  12/23/2011  ADDENDUM  [facility], however, there was no bed available, so [facility]. Patient has no symptoms overnight and vital signs stable. Physical examination is unremarkable. [facility].   ____________________________ Ryan PollackQing Dorien Bessent, MD qc:cms D: 12/25/2011 12:17:21 ET T: 12/25/2011 13:13:12 ET JOB#: [id number]  cc: Ryan PollackQing Yaritsa Savarino, MD, <Dictator> Ryan PollackQING Madalina Rosman MD ELECTRONICALLY SIGNED 12/26/2011 17:43

## 2015-04-20 ENCOUNTER — Encounter: Payer: Self-pay | Admitting: *Deleted

## 2015-04-20 ENCOUNTER — Encounter
Admission: RE | Disposition: A | Discharge status: Home / Self Care | Payer: Self-pay | Source: Ambulatory Visit | Attending: Cardiology

## 2015-04-20 ENCOUNTER — Ambulatory Visit
Admission: RE | Admit: 2015-04-20 | Discharge: 2015-04-20 | Disposition: A | Discharge status: Home / Self Care | Payer: Commercial Managed Care - HMO | Source: Ambulatory Visit | Attending: Cardiology | Admitting: Cardiology

## 2015-04-20 DIAGNOSIS — Z79899 Other long term (current) drug therapy: Secondary | ICD-10-CM | POA: Insufficient documentation

## 2015-04-20 DIAGNOSIS — R079 Chest pain, unspecified: Secondary | ICD-10-CM | POA: Diagnosis present

## 2015-04-20 DIAGNOSIS — Z7982 Long term (current) use of aspirin: Secondary | ICD-10-CM | POA: Insufficient documentation

## 2015-04-20 DIAGNOSIS — R0602 Shortness of breath: Secondary | ICD-10-CM | POA: Diagnosis not present

## 2015-04-20 DIAGNOSIS — I25810 Atherosclerosis of coronary artery bypass graft(s) without angina pectoris: Secondary | ICD-10-CM | POA: Insufficient documentation

## 2015-04-20 DIAGNOSIS — Z951 Presence of aortocoronary bypass graft: Secondary | ICD-10-CM | POA: Insufficient documentation

## 2015-04-20 DIAGNOSIS — E780 Pure hypercholesterolemia: Secondary | ICD-10-CM | POA: Diagnosis not present

## 2015-04-20 DIAGNOSIS — I259 Chronic ischemic heart disease, unspecified: Secondary | ICD-10-CM | POA: Diagnosis not present

## 2015-04-20 DIAGNOSIS — I2510 Atherosclerotic heart disease of native coronary artery without angina pectoris: Secondary | ICD-10-CM | POA: Diagnosis present

## 2015-04-20 DIAGNOSIS — I252 Old myocardial infarction: Secondary | ICD-10-CM | POA: Diagnosis not present

## 2015-04-20 HISTORY — DX: Atherosclerotic heart disease of native coronary artery without angina pectoris: I25.10

## 2015-04-20 HISTORY — DX: Major depressive disorder, single episode, unspecified: F32.9

## 2015-04-20 HISTORY — DX: Angina pectoris, unspecified: I20.9

## 2015-04-20 HISTORY — DX: Essential (primary) hypertension: I10

## 2015-04-20 HISTORY — PX: CARDIAC CATHETERIZATION: SHX172

## 2015-04-20 HISTORY — DX: Acute myocardial infarction, unspecified: I21.9

## 2015-04-20 HISTORY — DX: Depression, unspecified: F32.A

## 2015-04-20 SURGERY — LEFT HEART CATH AND CORS/GRAFTS ANGIOGRAPHY

## 2015-04-20 MED ORDER — HEPARIN (PORCINE) IN NACL 2-0.9 UNIT/ML-% IJ SOLN
INTRAMUSCULAR | Status: AC
  Filled 2015-04-20: qty 1000

## 2015-04-20 MED ORDER — AMLODIPINE BESYLATE 5 MG PO TABS
10.0000 mg | ORAL_TABLET | Freq: Every day | ORAL | Status: DC
  Administered 2015-04-20: 10 mg via ORAL

## 2015-04-20 MED ORDER — FENTANYL CITRATE (PF) 100 MCG/2ML IJ SOLN
INTRAMUSCULAR | Status: AC
  Filled 2015-04-20: qty 2

## 2015-04-20 MED ORDER — SODIUM CHLORIDE 0.9 % IV SOLN
INTRAVENOUS | Status: DC
  Administered 2015-04-20: 08:00:00 via INTRAVENOUS

## 2015-04-20 MED ORDER — MIDAZOLAM HCL 2 MG/2ML IJ SOLN
INTRAMUSCULAR | Status: AC
  Filled 2015-04-20: qty 2

## 2015-04-20 MED ORDER — IOHEXOL 300 MG/ML  SOLN
INTRAMUSCULAR | Status: DC | PRN
  Administered 2015-04-20: 140 mL via INTRA_ARTERIAL
  Administered 2015-04-20: 30 mL via INTRA_ARTERIAL

## 2015-04-20 MED ORDER — FENTANYL CITRATE (PF) 100 MCG/2ML IJ SOLN
INTRAMUSCULAR | Status: DC | PRN
  Administered 2015-04-20: 50 ug via INTRAVENOUS

## 2015-04-20 MED ORDER — MIDAZOLAM HCL 2 MG/2ML IJ SOLN
INTRAMUSCULAR | Status: DC | PRN
  Administered 2015-04-20: 1 mg via INTRAVENOUS

## 2015-04-20 MED ORDER — AMLODIPINE BESYLATE 5 MG PO TABS
ORAL_TABLET | ORAL | Status: AC
  Filled 2015-04-20: qty 2

## 2015-04-20 MED ORDER — SODIUM CHLORIDE 0.9 % IJ SOLN: 3.0000 mL | INTRAMUSCULAR | Status: DC | PRN

## 2015-04-20 SURGICAL SUPPLY — 9 items
CATH INFINITI 5FR ANG PIGTAIL (CATHETERS) ×2 IMPLANT
CATH INFINITI 5FR JL4 (CATHETERS) ×2 IMPLANT
CATH INFINITI JR4 5F (CATHETERS) ×2 IMPLANT
DEVICE CLOSURE MYNXGRIP 5F (Vascular Products) ×2 IMPLANT
KIT MANI 3VAL PERCEP (MISCELLANEOUS) ×2 IMPLANT
NEEDLE PERC 18GX7CM (NEEDLE) ×2 IMPLANT
PACK CARDIAC CATH (CUSTOM PROCEDURE TRAY) ×2 IMPLANT
SHEATH AVANTI 5FR X 11CM (SHEATH) ×2 IMPLANT
WIRE EMERALD 3MM-J .035X150CM (WIRE) ×2 IMPLANT

## 2015-04-20 NOTE — Discharge Instructions (Signed)

## 2019-01-03 ENCOUNTER — Other Ambulatory Visit: Payer: Self-pay

## 2019-01-03 ENCOUNTER — Inpatient Hospital Stay
Admission: EM | Admit: 2019-01-03 | Discharge: 2019-01-04 | DRG: 281 | Disposition: A | Discharge status: Home / Self Care | Payer: BLUE CROSS/BLUE SHIELD | Attending: Internal Medicine | Admitting: Internal Medicine

## 2019-01-03 ENCOUNTER — Encounter
Admission: EM | Disposition: A | Discharge status: Home / Self Care | Payer: Self-pay | Source: Home / Self Care | Attending: Internal Medicine

## 2019-01-03 ENCOUNTER — Encounter: Payer: Self-pay | Admitting: Emergency Medicine

## 2019-01-03 ENCOUNTER — Emergency Department: Payer: BLUE CROSS/BLUE SHIELD

## 2019-01-03 DIAGNOSIS — Z951 Presence of aortocoronary bypass graft: Secondary | ICD-10-CM | POA: Diagnosis not present

## 2019-01-03 DIAGNOSIS — I252 Old myocardial infarction: Secondary | ICD-10-CM

## 2019-01-03 DIAGNOSIS — Z8249 Family history of ischemic heart disease and other diseases of the circulatory system: Secondary | ICD-10-CM | POA: Diagnosis not present

## 2019-01-03 DIAGNOSIS — Z79899 Other long term (current) drug therapy: Secondary | ICD-10-CM | POA: Diagnosis not present

## 2019-01-03 DIAGNOSIS — Z1159 Encounter for screening for other viral diseases: Secondary | ICD-10-CM | POA: Diagnosis not present

## 2019-01-03 DIAGNOSIS — N400 Enlarged prostate without lower urinary tract symptoms: Secondary | ICD-10-CM | POA: Diagnosis present

## 2019-01-03 DIAGNOSIS — I25810 Atherosclerosis of coronary artery bypass graft(s) without angina pectoris: Secondary | ICD-10-CM | POA: Diagnosis present

## 2019-01-03 DIAGNOSIS — I509 Heart failure, unspecified: Secondary | ICD-10-CM | POA: Diagnosis present

## 2019-01-03 DIAGNOSIS — I249 Acute ischemic heart disease, unspecified: Secondary | ICD-10-CM | POA: Diagnosis not present

## 2019-01-03 DIAGNOSIS — E785 Hyperlipidemia, unspecified: Secondary | ICD-10-CM | POA: Diagnosis present

## 2019-01-03 DIAGNOSIS — I2119 ST elevation (STEMI) myocardial infarction involving other coronary artery of inferior wall: Principal | ICD-10-CM | POA: Diagnosis present

## 2019-01-03 DIAGNOSIS — Z7982 Long term (current) use of aspirin: Secondary | ICD-10-CM

## 2019-01-03 DIAGNOSIS — I2510 Atherosclerotic heart disease of native coronary artery without angina pectoris: Secondary | ICD-10-CM | POA: Diagnosis present

## 2019-01-03 DIAGNOSIS — F329 Major depressive disorder, single episode, unspecified: Secondary | ICD-10-CM | POA: Diagnosis present

## 2019-01-03 DIAGNOSIS — Z87891 Personal history of nicotine dependence: Secondary | ICD-10-CM | POA: Diagnosis not present

## 2019-01-03 DIAGNOSIS — I110 Hypertensive heart disease with heart failure: Secondary | ICD-10-CM | POA: Diagnosis present

## 2019-01-03 HISTORY — PX: CORONARY/GRAFT ACUTE MI REVASCULARIZATION: CATH118305

## 2019-01-03 HISTORY — DX: ST elevation (STEMI) myocardial infarction involving other coronary artery of inferior wall: I21.19

## 2019-01-03 HISTORY — PX: LEFT HEART CATH AND CORONARY ANGIOGRAPHY: CATH118249

## 2019-01-03 LAB — URINE DRUG SCREEN, QUALITATIVE (ARMC ONLY)
Amphetamines, Ur Screen: NOT DETECTED
Barbiturates, Ur Screen: NOT DETECTED
Benzodiazepine, Ur Scrn: POSITIVE — AB
Cannabinoid 50 Ng, Ur ~~LOC~~: NOT DETECTED
Cocaine Metabolite,Ur ~~LOC~~: NOT DETECTED
MDMA (Ecstasy)Ur Screen: NOT DETECTED
Methadone Scn, Ur: NOT DETECTED
Opiate, Ur Screen: NOT DETECTED
Phencyclidine (PCP) Ur S: NOT DETECTED
Tricyclic, Ur Screen: NOT DETECTED

## 2019-01-03 LAB — BASIC METABOLIC PANEL
Anion gap: 8 (ref 5–15)
BUN: 18 mg/dL (ref 8–23)
CO2: 27 mmol/L (ref 22–32)
Calcium: 9.5 mg/dL (ref 8.9–10.3)
Chloride: 102 mmol/L (ref 98–111)
Creatinine, Ser: 1.32 mg/dL — ABNORMAL HIGH (ref 0.61–1.24)
GFR calc Af Amer: >60 mL/min (ref >60)
GFR calc non Af Amer: 57 mL/min — ABNORMAL LOW (ref >60)
Glucose, Bld: 111 mg/dL — ABNORMAL HIGH (ref 70–99)
Potassium: 3.9 mmol/L (ref 3.5–5.1)
Sodium: 137 mmol/L (ref 135–145)

## 2019-01-03 LAB — CBC WITH DIFFERENTIAL/PLATELET
Abs Immature Granulocytes: 0.01 10*3/uL (ref 0.00–0.07)
Basophils Absolute: 0 10*3/uL (ref 0.0–0.1)
Basophils Relative: 1 %
Eosinophils Absolute: 0.3 10*3/uL (ref 0.0–0.5)
Eosinophils Relative: 6 %
HCT: 42.1 % (ref 39.0–52.0)
Hemoglobin: 14.3 g/dL (ref 13.0–17.0)
Immature Granulocytes: 0 %
Lymphocytes Relative: 45 %
Lymphs Abs: 1.9 10*3/uL (ref 0.7–4.0)
MCH: 32.1 pg (ref 26.0–34.0)
MCHC: 34 g/dL (ref 30.0–36.0)
MCV: 94.4 fL (ref 80.0–100.0)
Monocytes Absolute: 0.3 10*3/uL (ref 0.1–1.0)
Monocytes Relative: 8 %
Neutro Abs: 1.6 10*3/uL — ABNORMAL LOW (ref 1.7–7.7)
Neutrophils Relative %: 40 %
Platelets: 276 10*3/uL (ref 150–400)
RBC: 4.46 MIL/uL (ref 4.22–5.81)
RDW: 11.4 % — ABNORMAL LOW (ref 11.5–15.5)
WBC: 4.1 10*3/uL (ref 4.0–10.5)
nRBC: 0 % (ref 0.0–0.2)

## 2019-01-03 LAB — PROTIME-INR
INR: 1 (ref 0.8–1.2)
Prothrombin Time: 13.2 seconds (ref 11.4–15.2)

## 2019-01-03 LAB — TROPONIN I: Troponin I: <0.03 ng/mL (ref <0.03)

## 2019-01-03 LAB — SARS CORONAVIRUS 2 BY RT PCR (HOSPITAL ORDER, PERFORMED IN ~~LOC~~ HOSPITAL LAB): SARS Coronavirus 2: NEGATIVE

## 2019-01-03 LAB — APTT: aPTT: 34 seconds (ref 24–36)

## 2019-01-03 LAB — MRSA PCR SCREENING: MRSA by PCR: NEGATIVE

## 2019-01-03 LAB — GLUCOSE, CAPILLARY: Glucose-Capillary: 99 mg/dL (ref 70–99)

## 2019-01-03 SURGERY — CORONARY/GRAFT ACUTE MI REVASCULARIZATION
Anesthesia: Moderate Sedation

## 2019-01-03 MED ORDER — HEPARIN (PORCINE) IN NACL 1000-0.9 UT/500ML-% IV SOLN
INTRAVENOUS | Status: DC | PRN
  Administered 2019-01-03: 1000 mL

## 2019-01-03 MED ORDER — TAMSULOSIN HCL 0.4 MG PO CAPS
0.4000 mg | ORAL_CAPSULE | Freq: Every day | ORAL | Status: DC
  Administered 2019-01-03: 21:00:00 0.4 mg via ORAL
  Filled 2019-01-03 (×2): qty 1

## 2019-01-03 MED ORDER — CLOPIDOGREL BISULFATE 75 MG PO TABS
600.0000 mg | ORAL_TABLET | Freq: Once | ORAL | Status: AC
  Administered 2019-01-03: 600 mg via ORAL
  Filled 2019-01-03: qty 8

## 2019-01-03 MED ORDER — ASPIRIN EC 81 MG PO TBEC
81.0000 mg | DELAYED_RELEASE_TABLET | Freq: Every day | ORAL | Status: DC
  Administered 2019-01-04: 81 mg via ORAL
  Filled 2019-01-03: qty 1

## 2019-01-03 MED ORDER — BUPROPION HCL ER (XL) 300 MG PO TB24
300.0000 mg | ORAL_TABLET | Freq: Every day | ORAL | Status: DC
  Administered 2019-01-04: 300 mg via ORAL
  Filled 2019-01-03 (×2): qty 1

## 2019-01-03 MED ORDER — ISOSORBIDE MONONITRATE ER 30 MG PO TB24
30.0000 mg | ORAL_TABLET | Freq: Every day | ORAL | Status: DC
  Administered 2019-01-04: 30 mg via ORAL
  Filled 2019-01-03: qty 1

## 2019-01-03 MED ORDER — IOHEXOL 300 MG/ML  SOLN
INTRAMUSCULAR | Status: DC | PRN
  Administered 2019-01-03: 15:00:00 80 mL

## 2019-01-03 MED ORDER — ADULT MULTIVITAMIN W/MINERALS CH
1.0000 | ORAL_TABLET | Freq: Every day | ORAL | Status: DC
  Administered 2019-01-04: 1 via ORAL
  Filled 2019-01-03: qty 1

## 2019-01-03 MED ORDER — MELOXICAM 7.5 MG PO TABS
7.5000 mg | ORAL_TABLET | Freq: Two times a day (BID) | ORAL | Status: DC
  Administered 2019-01-03: 21:00:00 7.5 mg via ORAL
  Filled 2019-01-03 (×3): qty 1

## 2019-01-03 MED ORDER — CLOPIDOGREL BISULFATE 75 MG PO TABS
75.0000 mg | ORAL_TABLET | Freq: Every day | ORAL | Status: DC
  Administered 2019-01-04: 08:00:00 75 mg via ORAL
  Filled 2019-01-03: qty 1

## 2019-01-03 MED ORDER — SODIUM CHLORIDE 0.9 % WEIGHT BASED INFUSION
1.0000 mL/kg/h | INTRAVENOUS | Status: AC
  Administered 2019-01-03: 1 mL/kg/h via INTRAVENOUS

## 2019-01-03 MED ORDER — MIDAZOLAM HCL 2 MG/2ML IJ SOLN
INTRAMUSCULAR | Status: AC
  Filled 2019-01-03: qty 2

## 2019-01-03 MED ORDER — SODIUM CHLORIDE 0.9 % IV SOLN: 250.0000 mL | INTRAVENOUS | Status: DC | PRN

## 2019-01-03 MED ORDER — FENTANYL CITRATE (PF) 100 MCG/2ML IJ SOLN
INTRAMUSCULAR | Status: DC | PRN
  Administered 2019-01-03: 25 ug via INTRAVENOUS

## 2019-01-03 MED ORDER — BIVALIRUDIN TRIFLUOROACETATE 250 MG IV SOLR
INTRAVENOUS | Status: AC
  Filled 2019-01-03: qty 250

## 2019-01-03 MED ORDER — FINASTERIDE 5 MG PO TABS
5.0000 mg | ORAL_TABLET | Freq: Every day | ORAL | Status: DC
  Administered 2019-01-03 – 2019-01-04 (×2): 5 mg via ORAL
  Filled 2019-01-03 (×2): qty 1

## 2019-01-03 MED ORDER — HEPARIN (PORCINE) IN NACL 1000-0.9 UT/500ML-% IV SOLN
INTRAVENOUS | Status: AC
  Filled 2019-01-03: qty 1000

## 2019-01-03 MED ORDER — VITAMIN D (ERGOCALCIFEROL) 1.25 MG (50000 UNIT) PO CAPS: 50000.0000 [IU] | ORAL_CAPSULE | ORAL | Status: DC

## 2019-01-03 MED ORDER — SODIUM CHLORIDE 0.9% FLUSH: 3.0000 mL | INTRAVENOUS | Status: DC | PRN

## 2019-01-03 MED ORDER — FENTANYL CITRATE (PF) 100 MCG/2ML IJ SOLN
INTRAMUSCULAR | Status: AC
  Filled 2019-01-03: qty 2

## 2019-01-03 MED ORDER — NITROGLYCERIN IN D5W 200-5 MCG/ML-% IV SOLN
0.0000 ug/min | INTRAVENOUS | Status: DC
  Administered 2019-01-03: 10 ug/min via INTRAVENOUS

## 2019-01-03 MED ORDER — MIDAZOLAM HCL 2 MG/2ML IJ SOLN
INTRAMUSCULAR | Status: DC | PRN
  Administered 2019-01-03: 1 mg via INTRAVENOUS

## 2019-01-03 MED ORDER — METOPROLOL SUCCINATE ER 50 MG PO TB24
50.0000 mg | ORAL_TABLET | Freq: Every day | ORAL | Status: DC
  Filled 2019-01-03: qty 1

## 2019-01-03 MED ORDER — HYDROCHLOROTHIAZIDE 12.5 MG PO CAPS
12.5000 mg | ORAL_CAPSULE | Freq: Every day | ORAL | Status: DC
  Administered 2019-01-04: 12.5 mg via ORAL
  Filled 2019-01-03: qty 1

## 2019-01-03 MED ORDER — ROSUVASTATIN CALCIUM 10 MG PO TABS
40.0000 mg | ORAL_TABLET | Freq: Every day | ORAL | Status: DC
  Administered 2019-01-03: 21:00:00 40 mg via ORAL
  Filled 2019-01-03: qty 4

## 2019-01-03 MED ORDER — DOCUSATE SODIUM 100 MG PO CAPS: 100.0000 mg | ORAL_CAPSULE | Freq: Two times a day (BID) | ORAL | Status: DC | PRN

## 2019-01-03 MED ORDER — ASPIRIN 81 MG PO CHEW
CHEWABLE_TABLET | ORAL | Status: AC
  Administered 2019-01-03: 14:00:00 243 mg via ORAL
  Filled 2019-01-03: qty 3

## 2019-01-03 MED ORDER — HEPARIN (PORCINE) 25000 UT/250ML-% IV SOLN
INTRAVENOUS | Status: AC
  Filled 2019-01-03: qty 250

## 2019-01-03 MED ORDER — HEPARIN (PORCINE) 25000 UT/250ML-% IV SOLN
1050.0000 [IU]/h | INTRAVENOUS | Status: DC
  Administered 2019-01-03: 1050 [IU]/h via INTRAVENOUS

## 2019-01-03 MED ORDER — NITROGLYCERIN 0.4 MG SL SUBL: 0.4000 mg | SUBLINGUAL_TABLET | SUBLINGUAL | Status: DC | PRN

## 2019-01-03 MED ORDER — ACETAMINOPHEN 325 MG PO TABS: 650.0000 mg | ORAL_TABLET | Freq: Four times a day (QID) | ORAL | Status: DC | PRN

## 2019-01-03 MED ORDER — ONDANSETRON HCL 4 MG/2ML IJ SOLN: 4.0000 mg | Freq: Four times a day (QID) | INTRAMUSCULAR | Status: DC | PRN

## 2019-01-03 MED ORDER — HEPARIN BOLUS VIA INFUSION
4000.0000 [IU] | Freq: Once | INTRAVENOUS | Status: AC
  Administered 2019-01-03: 14:00:00 4000 [IU] via INTRAVENOUS
  Filled 2019-01-03: qty 4000

## 2019-01-03 MED ORDER — ENOXAPARIN SODIUM 40 MG/0.4ML ~~LOC~~ SOLN
40.0000 mg | SUBCUTANEOUS | Status: DC
  Administered 2019-01-04: 40 mg via SUBCUTANEOUS
  Filled 2019-01-03: qty 0.4

## 2019-01-03 MED ORDER — ASPIRIN 81 MG PO CHEW
243.0000 mg | CHEWABLE_TABLET | Freq: Once | ORAL | Status: AC
  Administered 2019-01-03: 14:00:00 243 mg via ORAL

## 2019-01-03 MED ORDER — ZOLPIDEM TARTRATE 5 MG PO TABS: 5.0000 mg | ORAL_TABLET | Freq: Every evening | ORAL | Status: DC | PRN

## 2019-01-03 MED ORDER — SODIUM CHLORIDE 0.9% FLUSH
3.0000 mL | Freq: Two times a day (BID) | INTRAVENOUS | Status: DC
  Administered 2019-01-04 (×2): 3 mL via INTRAVENOUS

## 2019-01-03 MED ORDER — TICAGRELOR 90 MG PO TABS
ORAL_TABLET | ORAL | Status: AC
  Filled 2019-01-03: qty 2

## 2019-01-03 MED ORDER — HEPARIN SODIUM (PORCINE) 1000 UNIT/ML IJ SOLN
INTRAMUSCULAR | Status: AC
  Filled 2019-01-03: qty 1

## 2019-01-03 MED ORDER — AMLODIPINE BESYLATE 5 MG PO TABS
5.0000 mg | ORAL_TABLET | Freq: Every day | ORAL | Status: DC
  Administered 2019-01-04: 08:00:00 5 mg via ORAL
  Filled 2019-01-03: qty 1

## 2019-01-03 MED ORDER — LOSARTAN POTASSIUM 50 MG PO TABS
50.0000 mg | ORAL_TABLET | Freq: Every day | ORAL | Status: DC
  Administered 2019-01-04: 50 mg via ORAL
  Filled 2019-01-03: qty 1

## 2019-01-03 SURGICAL SUPPLY — 11 items
CANNULA 5F STIFF (CANNULA) ×2 IMPLANT
CATH INFINITI 5FR ANG PIGTAIL (CATHETERS) ×2 IMPLANT
CATH INFINITI 5FR JL4 (CATHETERS) ×2 IMPLANT
CATH INFINITI JR4 5F (CATHETERS) ×2 IMPLANT
DEVICE CLOSURE MYNXGRIP 6/7F (Vascular Products) ×2 IMPLANT
GLIDESHEATH SLEND SS 6F .021 (SHEATH) IMPLANT
KIT MANI 3VAL PERCEP (MISCELLANEOUS) ×2 IMPLANT
NEEDLE PERC 18GX7CM (NEEDLE) ×2 IMPLANT
PACK CARDIAC CATH (CUSTOM PROCEDURE TRAY) ×2 IMPLANT
SHEATH AVANTI 6FR X 11CM (SHEATH) ×2 IMPLANT
WIRE GUIDERIGHT .035X150 (WIRE) ×2 IMPLANT

## 2019-01-03 NOTE — ED Notes (Signed)
Pt Wife Kennon Rounds: [phone]

## 2019-01-03 NOTE — Consult Note (Signed)
Cardiology Consultation:   Stafford ID: Ryan Stafford MRN: [id number]; DOB: 1956-07-14  Admit date: 01/03/2019 Date of Consult: 01/03/2019  Primary Care Provider: Evelene Croon, MD Primary Cardiologist: Dr. Welton Flakes Primary Electrophysiologist:  None    Stafford Profile:   Ryan Stafford is a 63 y.o. male with a hx of coronary artery disease status post CABG who is being seen today for Ryan evaluation of chest pain and abnormal EKG at Ryan request of Dr. Marisa Severin.  History of Present Illness:   Ryan Stafford is a 62 year old male with known history of coronary artery disease status post remote CABG, essential hypertension and hyperlipidemia.  Most recent cardiac catheterization in 2016 showed diffuse disease in Ryan jump graft to PL branch that was left to be treated medically.  Ryan Stafford has been having intermittent episodes of substernal chest pain over Ryan last week which is typically exertional.  He had chest pain today while he was mowing his lawn and thus he came to Ryan emergency room for evaluation.  Initial EKG was unremarkable.  Ryan Stafford stood up to go to Ryan bathroom and started having severe substernal chest pain.  Repeat EKG showed inferolateral ST elevation and thus a code STEMI was activated.  Interestingly, within 10 minutes, his chest pain subsided with resolution of EKG changes.  Past Medical History:  Diagnosis Date  . Anginal pain (HCC)   . Coronary artery disease   . Depression   . Hypertension   . Myocardial infarction [facility])     Past Surgical History:  Procedure Laterality Date  . CARDIAC CATHETERIZATION N/A 04/20/2015   Procedure: Left Heart Cath and Cors/Grafts Angiography;  Surgeon: Marcina Millard, MD;  Location: [facility] INVASIVE CV LAB;  Service: Cardiovascular;  Laterality: N/A;  . CORONARY ARTERY BYPASS GRAFT       Home Medications:  Prior to Admission medications   Medication Sig Start Date End Date Taking? Authorizing Provider  acetaminophen (TYLENOL)  325 MG tablet Take 650 mg by mouth every 6 (six) hours as needed for mild pain.   Yes [provider]  amLODipine (NORVASC) 5 MG tablet Take 5 mg by mouth daily.    Yes [provider]  aspirin EC 81 MG tablet Take 81 mg by mouth daily.    Yes [provider]  buPROPion (WELLBUTRIN XL) 300 MG 24 hr tablet Take 300 mg by mouth daily.    Yes [provider]  docusate sodium (COLACE) 100 MG capsule Take 100 mg by mouth 2 (two) times daily as needed for mild constipation.    Yes [provider]  finasteride (PROSCAR) 5 MG tablet Take 5 mg by mouth daily.   Yes [provider]  hydrochlorothiazide (MICROZIDE) 12.5 MG capsule Take 12.5 mg by mouth daily.   Yes [provider]  isosorbide mononitrate (IMDUR) 30 MG 24 hr tablet Take 30 mg by mouth daily.   Yes [provider]  losartan (COZAAR) 50 MG tablet Take 50 mg by mouth daily.   Yes [provider]  meloxicam (MOBIC) 7.5 MG tablet Take 7.5 mg by mouth 2 (two) times a day.   Yes [provider]  metoprolol succinate (TOPROL-XL) 50 MG 24 hr tablet Take 50 mg by mouth daily.   Yes [provider]  Multiple Vitamin (MULTIVITAMIN WITH MINERALS) TABS tablet Take 1 tablet by mouth daily.   Yes [provider]  nitroGLYCERIN (NITROSTAT) 0.4 MG SL tablet Place 0.4 mg under Ryan tongue every 5 (five) minutes as needed  for chest pain.   Yes [provider]  rosuvastatin (CRESTOR) 40 MG tablet Take 40 mg by mouth daily.   Yes [provider]  tamsulosin (FLOMAX) 0.4 MG CAPS capsule Take 0.4 mg by mouth daily.    Yes [provider]  Vitamin D, Ergocalciferol, (DRISDOL) 1.25 MG (50000 UT) CAPS capsule Take 50,000 Units by mouth once a week.   Yes [provider]  zolpidem (AMBIEN) 5 MG tablet Take 5 mg by mouth at bedtime as needed for sleep.   Yes [provider]    Inpatient Medications: Scheduled Meds: .  [START ON 01/04/2019] amLODipine  5 mg Oral Daily  . aspirin EC  81 mg Oral Daily  . buPROPion  300 mg Oral Daily  . clopidogrel  600 mg Oral Once  . [START ON 01/04/2019] clopidogrel  75 mg Oral Q breakfast  . [START ON 01/04/2019] enoxaparin (LOVENOX) injection  40 mg Subcutaneous Q24H  . finasteride  5 mg Oral Daily  . hydrochlorothiazide  12.5 mg Oral Daily  . [START ON 01/04/2019] isosorbide mononitrate  30 mg Oral Daily  . [START ON 01/04/2019] losartan  50 mg Oral Daily  . meloxicam  7.5 mg Oral BID  . [START ON 01/04/2019] metoprolol succinate  50 mg Oral Daily  . multivitamin with minerals  1 tablet Oral Daily  . rosuvastatin  40 mg Oral Daily  . [START ON 01/04/2019] sodium chloride flush  3 mL Intravenous Q12H  . tamsulosin  0.4 mg Oral Daily  . Vitamin D (Ergocalciferol)  50,000 Units Oral Weekly   Continuous Infusions: . [START ON 01/04/2019] sodium chloride    . sodium chloride    . heparin     PRN Meds: [START ON 01/04/2019] sodium chloride, acetaminophen, docusate sodium, nitroGLYCERIN, ondansetron (ZOFRAN) IV, [START ON 01/04/2019] sodium chloride flush, zolpidem  Allergies:   No Known Allergies  Social History:   Social History   Socioeconomic History  . Marital status: Married    Spouse name: Not on file  . Number of children: Not on file  . Years of education: Not on file  . Highest education level: Not on file  Occupational History  . Not on file  Social Needs  . Financial resource strain: Not on file  . Food insecurity:    Worry: Not on file    Inability: Not on file  . Transportation needs:    Medical: Not on file    Non-medical: Not on file  Tobacco Use  . Smoking status: Former Smoker    Packs/day: 1.00    Years: 40.00    Pack years: 40.00    Types: Cigarettes    Last attempt to quit: 04/20/2011    Years since quitting: 7.7  Substance and Sexual Activity  . Alcohol use: Not on file  . Drug use: Not on file  . Sexual activity: Yes  Lifestyle  .  Physical activity:    Days per week: Not on file    Minutes per session: Not on file  . Stress: Not on file  Relationships  . Social connections:    Talks on phone: Not on file    Gets together: Not on file    Attends religious service: Not on file    Active member of club or organization: Not on file    Attends meetings of clubs or organizations: Not on file    Relationship status: Not on file  . Intimate partner violence:    Fear  of current or ex partner: Not on file    Emotionally abused: Not on file    Physically abused: Not on file    Forced sexual activity: Not on file  Other Topics Concern  . Not on file  Social History Narrative  . Not on file    Family History:   Family history is negative for coronary artery disease.  ROS:  Please see Ryan history of present illness.   All other ROS reviewed and negative.     Physical Exam/Data:   Vitals:   01/03/19 1315 01/03/19 1330 01/03/19 1341 01/03/19 1342  BP:  136/87 (!) 124/105   Pulse: (!) 56 (!) 52  (!) 57  Resp: 15 19    Temp:      TempSrc:      SpO2: 99% 99%  96%  Weight:      Height:       No intake or output data in Ryan 24 hours ending 01/03/19 1539 Last 3 Weights 01/03/2019 04/20/2015  Weight (lbs) 198 lb 208 lb  Weight (kg) 89.812 kg 94.348 kg     Body mass index is 26.85 kg/m.  General:  Well nourished, well developed, in no acute distress HEENT: normal Lymph: no adenopathy Neck: no JVD Endocrine:  No thryomegaly Vascular: No carotid bruits; FA pulses 2+ bilaterally without bruits  Cardiac:  normal S1, S2; RRR; no murmur  Lungs:  clear to auscultation bilaterally, no wheezing, rhonchi or rales  Abd: soft, nontender, no hepatomegaly  Ext: no edema Musculoskeletal:  No deformities, BUE and BLE strength normal and equal Skin: warm and dry  Neuro:  CNs 2-12 intact, no focal abnormalities noted Psych:  Normal affect   EKG:  Ryan EKG was personally reviewed and demonstrates: Normal sinus rhythm with  LVH and inferolateral ST elevation that resolved with resolution of symptoms.   Relevant CV Studies: Cardiac catheterization was done today:  1.  Borderline significant two-vessel coronary artery disease.  Atretic LIMA to LAD but Ryan native LAD does not appear to have obstructive disease.  SVG to PL 1 is patent but Ryan Y graft going to PL 2 is occluded which is Ryan likely culprit for myocardial infarction.  However, Ryan supply territory appears relatively small and some collaterals are already noted to that area.  This segment was diffusely diseased on previous cardiac catheterization 2016. 2.  Normal left ventricular end-diastolic pressure.  Left ventricular angiography was not performed.  Recommendations: Ryan Stafford had transient chest pain and anterolateral ST elevation which resolved completely by Ryan time of cardiac catheterization.  Ryan suspected culprit is likely Ryan Y graft to PL 2 but Ryan supplied area is relatively small with noted collaterals.  This area was diffusely diseased on cardiac catheterization in 2016.  Thus, Ryan risks of intervention outweigh Ryan benefit and I recommend aggressive medical therapy.  It is expected for Ryan troponin to increase.  I added Plavix and this should be used for at least 1 year.  Intensify antianginal therapy.  Ryan Stafford does have diffuse small vessel disease in Ryan left circumflex. Obtain an echocardiogram to evaluate LV systolic function.  Laboratory Data:  Chemistry Recent Labs  Lab 01/03/19 1211  NA 137  K 3.9  CL 102  CO2 27  GLUCOSE 111*  BUN 18  CREATININE 1.32*  CALCIUM 9.5  GFRNONAA 57*  GFRAA >60  ANIONGAP 8    No results for input(s): PROT, ALBUMIN, AST, ALT, ALKPHOS, BILITOT in Ryan last 168 hours. Hematology  Recent Labs  Lab 01/03/19 1211  WBC 4.1  RBC 4.46  HGB 14.3  HCT 42.1  MCV 94.4  MCH 32.1  MCHC 34.0  RDW 11.4*  PLT 276   Cardiac Enzymes Recent Labs  Lab 01/03/19 1211  TROPONINI <0.03   No results  for input(s): TROPIPOC in Ryan last 168 hours.  BNPNo results for input(s): BNP, PROBNP in Ryan last 168 hours.  DDimer No results for input(s): DDIMER in Ryan last 168 hours.  Radiology/Studies:  Dg Chest 2 View  Result Date: 01/03/2019 CLINICAL DATA:  Epigastric and lower chest pain starting this morning. EXAM: CHEST - 2 VIEW COMPARISON:  Chest x-ray dated 12/21/2011. FINDINGS: Surgical changes of a presumed CABG. Median sternotomy wires appear intact and appropriately aligned. Heart size and mediastinal contours are within normal limits. Lungs are clear. No pleural effusion or pneumothorax seen. No acute or suspicious osseous finding. IMPRESSION: No active cardiopulmonary disease. No evidence of pneumonia or pulmonary edema. Electronically Signed   By: Bary RichardStan  Maynard M.D.   On: 01/03/2019 12:53    Assessment and Plan:   1. Inferolateral ST elevation myocardial infarction: Ryan suspected culprit is Ryan jump graft from SVG to PL 2 which was diffusely diseased on previous cardiac catheterization in 2016.  This supplies a relatively small area and some collaterals were noted.  Thus, I have recommended medical therapy for this.  I added clopidogrel to be used for at least 1 year.  Continue aspirin indefinitely.  There might have been a component of spasm and if Ryan Stafford has recurrent chest pain, I recommend intensifying his nitroglycerin therapy or using nitroglycerin drip in Ryan short-term.  I requested an echocardiogram to evaluate LV systolic function. 2. Hyperlipidemia: Continue high-dose rosuvastatin. 3. Essential hypertension: Blood pressure is controlled.  Please consult Dr. Welton FlakesKhan to round on Ryan Stafford tomorrow as he is Ryan primary cardiologist.    For questions or updates, please contact [facility] Please consult [email or web address] for contact info under     Signed, Lorine BearsMuhammad Kryssa Risenhoover, MD  01/03/2019 3:39 PM

## 2019-01-03 NOTE — ED Notes (Signed)
CODE  STEMI  CALLED  TO  [facility] 

## 2019-01-03 NOTE — H&P (Signed)
[employer] [facility] [place] [facility]    PATIENT NAME: Ryan Stafford    MR#:  [id number]  DATE OF BIRTH:  01/11/1956  DATE OF ADMISSION:  01/03/2019  PRIMARY CARE PHYSICIAN: Evelene CroonNiemeyer, Meindert, MD   REQUESTING/REFERRING PHYSICIAN: Dr. Marisa SeverinSiadecki.    CHIEF COMPLAINT:   Chief Complaint  Patient presents with  . Chest Pain    HISTORY OF PRESENT ILLNESS:  Ryan Bislvis Gander  is a 63 y.o. male with a known history of coronary artery disease status post bypass, hypertension, hyperlipidemia, BPH, osteoarthritis, depression who presents to the hospital due to chest pain.  Patient says he has been having chest pain on and off for the past 2 days.  He describes the pain as a center of his chest nonradiating associated with some diaphoresis but no nausea vomiting palpitations or dizziness.  Patient says usually his symptoms last about few minutes and he sits down or stands up and they resolve but today he had 3 episodes of chest pain which were progressively getting worse and therefore came to the ER for further evaluation.  Initially in the ER patient's first EKG did not show any acute ST-T wave changes.  He then developed some acute chest pain in the emergency room and underwent an EKG which showed inferior lateral ST elevations consistent with ST elevation MI.  Patient was urgently rushed to the cardiac catheterization lab and post cardiac catheterization he is now chest pain-free.  Patient's cardiac catheterization did not show any evidence of significant coronary artery disease that needed intervention.  suspected culprit is likely the Y graft to PL 2 but the supplied area is relatively small with noted collaterals.    Cardiology recommended medical management.  Patient was admitted to the ICU.  Hospitalist services were contacted for admission.  PAST MEDICAL HISTORY:   Past Medical History:  Diagnosis Date  . Anginal pain (HCC)   . Coronary artery disease   . Depression   .  Hypertension   . Myocardial infarction [facility](HCC)     PAST SURGICAL HISTORY:   Past Surgical History:  Procedure Laterality Date  . CARDIAC CATHETERIZATION N/A 04/20/2015   Procedure: Left Heart Cath and Cors/Grafts Angiography;  Surgeon: Marcina MillardAlexander Paraschos, MD;  Location: [facility] INVASIVE CV LAB;  Service: Cardiovascular;  Laterality: N/A;  . CORONARY ARTERY BYPASS GRAFT      SOCIAL HISTORY:   Social History   Tobacco Use  . Smoking status: Former Smoker    Packs/day: 1.00    Years: 40.00    Pack years: 40.00    Types: Cigarettes    Last attempt to quit: 04/20/2011    Years since quitting: 7.7  Substance Use Topics  . Alcohol use: Not on file    FAMILY HISTORY:   Family History  Problem Relation Age of Onset  . Hypertension Father   . Hypertension Brother     DRUG ALLERGIES:  No Known Allergies  REVIEW OF SYSTEMS:   Review of Systems  Constitutional: Negative for fever and weight loss.  HENT: Negative for congestion, nosebleeds and tinnitus.   Eyes: Negative for blurred vision, double vision and redness.  Respiratory: Negative for cough, hemoptysis and shortness of breath.   Cardiovascular: Positive for chest pain. Negative for orthopnea, leg swelling and PND.  Gastrointestinal: Negative for abdominal pain, diarrhea, melena, nausea and vomiting.  Genitourinary: Negative for dysuria, hematuria and urgency.  Musculoskeletal: Negative for falls and joint pain.  Neurological: Negative for dizziness, tingling, sensory change, focal weakness, seizures,  weakness and headaches.  Endo/Heme/Allergies: Negative for polydipsia. Does not bruise/bleed easily.  Psychiatric/Behavioral: Negative for depression and memory loss. The patient is not nervous/anxious.     MEDICATIONS AT HOME:   Prior to Admission medications   Medication Sig Start Date End Date Taking? Authorizing Provider  acetaminophen (TYLENOL) 325 MG tablet Take 650 mg by mouth every 6 (six) hours as needed for mild  pain.   Yes [provider]  amLODipine (NORVASC) 5 MG tablet Take 5 mg by mouth daily.    Yes [provider]  aspirin EC 81 MG tablet Take 81 mg by mouth daily.    Yes [provider]  buPROPion (WELLBUTRIN XL) 300 MG 24 hr tablet Take 300 mg by mouth daily.    Yes [provider]  docusate sodium (COLACE) 100 MG capsule Take 100 mg by mouth 2 (two) times daily as needed for mild constipation.    Yes [provider]  finasteride (PROSCAR) 5 MG tablet Take 5 mg by mouth daily.   Yes [provider]  hydrochlorothiazide (MICROZIDE) 12.5 MG capsule Take 12.5 mg by mouth daily.   Yes [provider]  isosorbide mononitrate (IMDUR) 30 MG 24 hr tablet Take 30 mg by mouth daily.   Yes [provider]  losartan (COZAAR) 50 MG tablet Take 50 mg by mouth daily.   Yes [provider]  meloxicam (MOBIC) 7.5 MG tablet Take 7.5 mg by mouth 2 (two) times a day.   Yes [provider]  metoprolol succinate (TOPROL-XL) 50 MG 24 hr tablet Take 50 mg by mouth daily.   Yes [provider]  Multiple Vitamin (MULTIVITAMIN WITH MINERALS) TABS tablet Take 1 tablet by mouth daily.   Yes [provider]  nitroGLYCERIN (NITROSTAT) 0.4 MG SL tablet Place 0.4 mg under the tongue every 5 (five) minutes as needed for chest pain.   Yes [provider]  rosuvastatin (CRESTOR) 40 MG tablet Take 40 mg by mouth daily.   Yes [provider]  tamsulosin (FLOMAX) 0.4 MG CAPS capsule Take 0.4 mg by mouth daily.    Yes [provider]  Vitamin D, Ergocalciferol, (DRISDOL) 1.25 MG (50000 UT) CAPS capsule Take 50,000 Units by mouth once a week.   Yes [provider]  zolpidem (AMBIEN) 5 MG tablet Take 5 mg by mouth at bedtime as needed for sleep.   Yes [provider]      VITAL SIGNS:  Blood pressure (!) 138/92, pulse (!) 59, temperature 97.8 F (36.6 C), temperature source Oral,  resp. rate 18, height 6' (1.829 m), weight 89.8 kg, SpO2 96 %.  PHYSICAL EXAMINATION:  Physical Exam  GENERAL:  63 y.o.-year-old patient lying in the bed in no acute distress.  EYES: Pupils equal, round, reactive to light and accommodation. No scleral icterus. Extraocular muscles intact.  HEENT: Head atraumatic, normocephalic. Oropharynx and nasopharynx clear. No oropharyngeal erythema, moist oral mucosa  NECK:  Supple, no jugular venous distention. No thyroid enlargement, no tenderness.  LUNGS: Normal breath sounds bilaterally, no wheezing, rales, rhonchi. No use of accessory muscles of respiration.  CARDIOVASCULAR: S1, S2 RRR. No murmurs, rubs, gallops, clicks.  ABDOMEN: Soft, nontender, nondistended. Bowel sounds present. No organomegaly or mass.  EXTREMITIES: No pedal edema, cyanosis, or clubbing. + 2 pedal & radial pulses b/l.   NEUROLOGIC: Cranial nerves II through XII are intact. No focal Motor or sensory deficits appreciated b/l PSYCHIATRIC: The patient is alert and oriented x 3.  SKIN:  No obvious rash, lesion, or ulcer.   LABORATORY PANEL:   CBC Recent Labs  Lab 01/03/19 1211  WBC 4.1  HGB 14.3  HCT 42.1  PLT 276   ------------------------------------------------------------------------------------------------------------------  Chemistries  Recent Labs  Lab 01/03/19 1211  NA 137  K 3.9  CL 102  CO2 27  GLUCOSE 111*  BUN 18  CREATININE 1.32*  CALCIUM 9.5   ------------------------------------------------------------------------------------------------------------------  Cardiac Enzymes Recent Labs  Lab 01/03/19 1211  TROPONINI <0.03   ------------------------------------------------------------------------------------------------------------------  RADIOLOGY:  Dg Chest 2 View  Result Date: 01/03/2019 CLINICAL DATA:  Epigastric and lower chest pain starting this morning. EXAM: CHEST - 2 VIEW COMPARISON:  Chest x-ray dated 12/21/2011. FINDINGS: Surgical  changes of a presumed CABG. Median sternotomy wires appear intact and appropriately aligned. Heart size and mediastinal contours are within normal limits. Lungs are clear. No pleural effusion or pneumothorax seen. No acute or suspicious osseous finding. IMPRESSION: No active cardiopulmonary disease. No evidence of pneumonia or pulmonary edema. Electronically Signed   By: Bary Richard M.D.   On: 01/03/2019 12:53     IMPRESSION AND PLAN:   63 year old male  With known history of coronary artery disease status post bypass, hypertension, hyperlipidemia, BPH, osteoarthritis, depression who presents to the hospital due to chest pain.  Patient says he has been having chest pain on and off for the past 2 days.  1.  Chest pain/ST elevation MI- patient presented to the hospital with progressive chest pain and had EKG changes consistent with ST elevation MI in the inferior lateral regions. - Patient was urgently taken to cardiac catheterization lab which showed Borderline significant two-vessel coronary artery disease.  SVG to PL 1 is patent but the Y graft going to PL 2 is occluded which is the likely culprit for myocardial infarction.  However, the supply territory appears relatively small and some collaterals are already noted to that area. -Cardiology recommended medical management for now.  Patient is currently chest pain-free. -Continue aspirin, Plavix was added for dual antiplatelet treatment. -Continue Imdur, Toprol, Crestor. - pt. To follow with Dr. Welton Flakes in a.m. as that's pt's primary Cardiologist.   2.  Essential hypertension-continue Toprol, losartan, Imdur, HCTZ, Norvasc.  3.  Hyperlipidemia-continue Crestor.  4.  BPH-no urine retention.  Continue Flomax, finasteride.  5.  Depression-continue Wellbutrin.    All the records are reviewed and case discussed with ED provider. Management plans discussed with the patient, family and they are in agreement.  CODE STATUS: Full code  TOTAL TIME  TAKING CARE OF THIS PATIENT: 45 minutes.    Houston Siren M.D on 01/03/2019 at 3:57 PM  Between 7am to 6pm - Pager - [phone]  After 6pm go to [email or web address] - password [email or web address] [facility]  [facility]  Office  [phone]  CC: Primary care physician; Evelene Croon, MD

## 2019-01-03 NOTE — ED Notes (Signed)
Pt called out for chest pain at this time. ekg repeated and MD notified at this time

## 2019-01-03 NOTE — Progress Notes (Signed)
   01/03/19 1400  Clinical Encounter Type  Visited With Patient;Health care provider  Visit Type Initial;Code  Referral From Nurse  Ch responded to Code Stemi. Pt says he is worried but is staying calm and feels secure in his faith. Pt has trust in the care team and feels well taken care. Ch prayed with the pt for a successful and safe catherization and quick recovery.

## 2019-01-03 NOTE — ED Notes (Signed)
covid swab taken to lab at this time

## 2019-01-03 NOTE — ED Triage Notes (Signed)
Patient reports epigastric and lower chest pain and pressure that started this morning. Denies SOB, N/V. Reports dizziness. Reports history of multiple MI.

## 2019-01-03 NOTE — ED Provider Notes (Signed)
[facility] Emergency Department Provider Note ____________________________________________   First MD Initiated Contact with Patient 01/03/19 1158     (approximate)  I have reviewed the triage vital signs and the nursing notes.   HISTORY  Chief Complaint Chest Pain    HPI Ryan Stafford is a 63 y.o. male with PMH as noted below who presents with chest pain, intermittent over the last 2 days, nonexertional, and described as pressure.  It is mostly in the lower part of his sternum.  He reports mild lightheadedness but states that this has been the case since he was changed to a different blood pressure medication recently.  He denies shortness of breath, fever, or weakness.  Past Medical History:  Diagnosis Date  . Anginal pain (HCC)   . Coronary artery disease   . Depression   . Hypertension   . Myocardial infarction (HCC)     There are no active problems to display for this patient.   Past Surgical History:  Procedure Laterality Date  . CARDIAC CATHETERIZATION N/A 04/20/2015   Procedure: Left Heart Cath and Cors/Grafts Angiography;  Surgeon: Marcina Millard, MD;  Location: [facility] INVASIVE CV LAB;  Service: Cardiovascular;  Laterality: N/A;  . CORONARY ARTERY BYPASS GRAFT      Prior to Admission medications   Medication Sig Start Date End Date Taking? Authorizing Provider  aspirin EC 81 MG tablet Take 81 mg by mouth daily.    Yes [provider]  isosorbide mononitrate (IMDUR) 30 MG 24 hr tablet Take 30 mg by mouth daily.   Yes [provider]  metoprolol succinate (TOPROL-XL) 50 MG 24 hr tablet Take 50 mg by mouth daily.   Yes [provider]  nitroGLYCERIN (NITROSTAT) 0.4 MG SL tablet Place 0.4 mg under the tongue every 5 (five) minutes as needed for chest pain.   Yes [provider]  rosuvastatin (CRESTOR) 40 MG tablet Take 40 mg by mouth daily.   Yes [provider]  acetaminophen (TYLENOL) 325 MG  tablet Take 650 mg by mouth every 6 (six) hours as needed for mild pain.    [provider]  amLODipine (NORVASC) 10 MG tablet Take 10 mg by mouth daily.    [provider]  atorvastatin (LIPITOR) 40 MG tablet Take 40 mg by mouth daily.    [provider]  buPROPion (WELLBUTRIN XL) 150 MG 24 hr tablet Take 150 mg by mouth daily.    [provider]  docusate sodium (COLACE) 100 MG capsule Take 100 mg by mouth 2 (two) times daily.    [provider]  finasteride (PROSCAR) 5 MG tablet Take 5 mg by mouth daily.    [provider]  losartan (COZAAR) 50 MG tablet Take 50 mg by mouth daily.    [provider]  sildenafil (REVATIO) 20 MG tablet Take 100 mg by mouth 2 (two) times daily as needed.    [provider]  sildenafil (VIAGRA) 100 MG tablet Take 100 mg by mouth daily as needed for erectile dysfunction.    [provider]  tamsulosin (FLOMAX) 0.4 MG CAPS capsule Take 0.4 mg by mouth.    [provider]  zolpidem (AMBIEN) 5 MG tablet Take 5 mg by mouth at bedtime as needed for sleep.    [provider]    Allergies Patient has no known allergies.  No family history on file.  Social History Social History   Tobacco Use  . Smoking status: Former Smoker  Packs/day: 1.00    Years: 40.00    Pack years: 40.00    Types: Cigarettes    Last attempt to quit: 04/20/2011    Years since quitting: 7.7  Substance Use Topics  . Alcohol use: Not on file  . Drug use: Not on file    Review of Systems  Constitutional: No fever/chills Eyes: No visual changes. ENT: No sore throat. Cardiovascular: Positive for chest pain. Respiratory: Denies shortness of breath. Gastrointestinal: No vomiting or diarrhea.  Genitourinary: Negative for flank pain.  Musculoskeletal: Negative for back pain. Skin: Negative for rash. Neurological: Negative for headache.   ____________________________________________    PHYSICAL EXAM:  VITAL SIGNS: ED Triage Vitals  Enc Vitals Group     BP 01/03/19 1151 125/75     Pulse Rate 01/03/19 1151 62     Resp 01/03/19 1151 16     Temp 01/03/19 1151 97.8 F (36.6 C)     Temp Source 01/03/19 1151 Oral     SpO2 01/03/19 1151 98 %     Weight 01/03/19 1152 198 lb (89.8 kg)     Height 01/03/19 1152 6' (1.829 m)     Head Circumference --      Peak Flow --      Pain Score 01/03/19 1151 0     Pain Loc --      Pain Edu? --      Excl. in GC? --     Constitutional: Alert and oriented. Well appearing and in no acute distress. Eyes: Conjunctivae are normal.  Head: Atraumatic. Nose: No congestion/rhinnorhea. Mouth/Throat: Mucous membranes are moist.   Neck: Normal range of motion.  Cardiovascular: Normal rate, regular rhythm. Good peripheral circulation. Respiratory: Normal respiratory effort.  No retractions. Gastrointestinal: No distention.  Musculoskeletal: No lower extremity edema.  Extremities warm and well perfused.  Neurologic:  Normal speech and language. No gross focal neurologic deficits are appreciated.  Skin:  Skin is warm and dry. No rash noted. Psychiatric: Mood and affect are normal. Speech and behavior are normal.  ____________________________________________   LABS (all labs ordered are listed, but only abnormal results are displayed)  Labs Reviewed  BASIC METABOLIC PANEL - Abnormal; Notable for the following components:      Result Value   Glucose, Bld 111 (*)    Creatinine, Ser 1.32 (*)    GFR calc non Af Amer 57 (*)    All other components within normal limits  CBC WITH DIFFERENTIAL/PLATELET - Abnormal; Notable for the following components:   RDW 11.4 (*)    Neutro Abs 1.6 (*)    All other components within normal limits  TROPONIN I  PROTIME-INR  HEPARIN LEVEL (UNFRACTIONATED)  APTT   ____________________________________________  EKG  ED ECG REPORT I, Dionne Bucy, the attending physician, personally viewed and  interpreted this ECG.  Date: 01/03/2019 EKG Time: 1153 Rate: 64 Rhythm: normal sinus rhythm QRS Axis: normal Intervals: normal ST/T Wave abnormalities: Nonspecific ST flattening laterally Narrative Interpretation: no evidence of acute ischemia; no significant change when compared to EKG of 04/20/2014    ED ECG REPORT I, Dionne Bucy, the attending physician, personally viewed and interpreted this ECG.  Date: 01/03/2019 EKG Time: 1328 Rate: 65 Rhythm: normal sinus rhythm QRS Axis: normal Intervals: normal ST/T Wave abnormalities: Marked inferior ST elevation with reciprocal changes anteriorly Narrative Interpretation: Concerning for STEMI   ED ECG REPORT I, Dionne Bucy, the attending physician, personally viewed and interpreted this ECG.  Date: 01/03/2019 EKG Time: 1331 Rate: 65  Rhythm: normal sinus rhythm QRS Axis: normal Intervals: normal ST/T Wave abnormalities: Inferior and lateral 1 to 2 mm ST elevation Narrative Interpretation: Improved ST elevations, concerning for acute ischemia    ____________________________________________  RADIOLOGY  CXR: No focal infiltrate or other acute abnormality  ____________________________________________   PROCEDURES  Procedure(s) performed: No  Procedures  Critical Care performed: Yes  CRITICAL CARE Performed by: Dionne BucySebastian Sathvik Tiedt   Total critical care time: 30 minutes  Critical care time was exclusive of separately billable procedures and treating other patients.  Critical care was necessary to treat or prevent imminent or life-threatening deterioration.  Critical care was time spent personally by me on the following activities: development of treatment plan with patient and/or surrogate as well as nursing, discussions with consultants, evaluation of patient's response to treatment, examination of patient, obtaining history from patient or surrogate, ordering and performing treatments and  interventions, ordering and review of laboratory studies, ordering and review of radiographic studies, pulse oximetry and re-evaluation of patient's condition.  ____________________________________________   INITIAL IMPRESSION / ASSESSMENT AND PLAN / ED COURSE  Pertinent labs & imaging results that were available during my care of the patient were reviewed by me and considered in my medical decision making (see chart for details).  63 year old male with PMH as noted above including CAD status post CABG who presents with atypical intermittent chest pain over the last 2 days which has been nonexertional.  It is located in the lower sternum/epigastric area.  He denies associated shortness of breath.  On exam, the patient is well-appearing.  His vital signs are normal.  The remainder of the exam is unremarkable.  EKG shows no ischemic findings.  Overall I have a low suspicion for ACS on this presentation although the patient's cardiac risk is elevated.  We will obtain lab work-up including troponin and reassess.  I will plan for ACS rule out with 2 troponins.  ----------------------------------------- 1:58 PM on 01/03/2019 -----------------------------------------  Initial troponin was negative.  The patient then developed chest pain again.  EKG was obtained while he was having the pain, showing significant inferior and lateral ST elevations meeting STEMI criteria.  I activated the STEMI team.  The patient had taken 1 aspirin at home so we gave another 3 here.  We also started the patient on heparin.  The chest pain resolved after few minutes without further intervention.  Repeat EKG showed significant improvement in the ST elevations looking more like his initial EKG.  I discussed the case with Dr. Kirke CorinArida from cardiology who agrees to take the patient to the Cath Lab.  I signed the patient out to the hospitalist Dr. Elpidio AnisSudini.  ____________________________________________   FINAL CLINICAL  IMPRESSION(S) / ED DIAGNOSES  Final diagnoses:  Acute coronary syndrome (HCC)      NEW MEDICATIONS STARTED DURING THIS VISIT:  New Prescriptions   No medications on file     Note:  This document was prepared using Dragon voice recognition software and may include unintentional dictation errors.    Dionne BucySiadecki, Shin Lamour, MD 01/03/19 1359

## 2019-01-03 NOTE — Consult Note (Signed)
ANTICOAGULATION CONSULT NOTE - Initial Consult  Pharmacy Consult for Heparin Indication: chest pain/ACS  No Known Allergies  Patient Measurements: Height: 6' (182.9 cm) Weight: 198 lb (89.8 kg) IBW/kg (Calculated) : 77.6 Heparin Dosing Weight: 89.8 kg  Vital Signs: Temp: 97.8 F (36.6 C) (05/25 1151) Temp Source: Oral (05/25 1151) BP: 125/75 (05/25 1151) Pulse Rate: 62 (05/25 1151)  Labs: Recent Labs    01/03/19 1211  HGB 14.3  HCT 42.1  PLT 276  CREATININE 1.32*  TROPONINI <0.03    Estimated Creatinine Clearance: 62.9 mL/min (A) (by C-G formula based on SCr of 1.32 mg/dL (H)).   Medical History: Past Medical History:  Diagnosis Date  . Anginal pain (HCC)   . Coronary artery disease   . Depression   . Hypertension   . Myocardial infarction [facility])     Medications:  Patient is NOT on any anticoagulation. Verified with patient.  Assessment: 63 yo male presenting with chest pain. PMH significant for CAD s/p CABG.  Goal of Therapy:  Heparin level 0.3-0.7 units/ml Monitor platelets by anticoagulation protocol: Yes   Plan:  H&H WNL. Baseline labs have been ordered.  Will order heparin bolus of 4000 units x 1 and continuous infusion of 1050 units/hr. Ordered heparin level for 2100.  Pharmacy will continue to monitor.  Mauri Reading, PharmD Pharmacy Resident  01/03/2019 1:51 PM

## 2019-01-03 NOTE — Progress Notes (Signed)
Patient with  Frequent PVC's and calling out stating he was having chest pain.  Patient started having 7-8 beat runs of wide QRS aberrant beats. Patient remained alert and oriented and verbalized this was much like his intermittent  chest pain at home.  Dr. Kirke Corin called and notified.  Order obtained for nitro drip and urine drug screen.

## 2019-01-03 NOTE — ED Notes (Signed)
Pt transported to cath lab at this time.

## 2019-01-03 NOTE — ED Notes (Signed)
Pt ambulatory up to toilet in room at this time  

## 2019-01-04 ENCOUNTER — Inpatient Hospital Stay
Admit: 2019-01-04 | Discharge: 2019-01-04 | Disposition: A | Discharge status: Home / Self Care | Payer: BLUE CROSS/BLUE SHIELD | Attending: Cardiovascular Disease | Admitting: Cardiovascular Disease

## 2019-01-04 ENCOUNTER — Encounter: Payer: Self-pay | Admitting: Cardiovascular Disease

## 2019-01-04 LAB — CBC
HCT: 40.2 % (ref 39.0–52.0)
Hemoglobin: 13.3 g/dL (ref 13.0–17.0)
MCH: 32.1 pg (ref 26.0–34.0)
MCHC: 33.1 g/dL (ref 30.0–36.0)
MCV: 97.1 fL (ref 80.0–100.0)
Platelets: 266 10*3/uL (ref 150–400)
RBC: 4.14 MIL/uL — ABNORMAL LOW (ref 4.22–5.81)
RDW: 11.6 % (ref 11.5–15.5)
WBC: 5.1 10*3/uL (ref 4.0–10.5)
nRBC: 0 % (ref 0.0–0.2)

## 2019-01-04 LAB — ECHOCARDIOGRAM COMPLETE
Height: 72 in
Weight: 3168 oz

## 2019-01-04 LAB — TROPONIN I: Troponin I: 0.06 ng/mL (ref <0.03)

## 2019-01-04 MED ORDER — ISOSORBIDE MONONITRATE ER 30 MG PO TB24: 30.0000 mg | ORAL_TABLET | Freq: Two times a day (BID) | ORAL | 0 refills | Status: DC

## 2019-01-04 MED ORDER — CLOPIDOGREL BISULFATE 75 MG PO TABS: 75.0000 mg | ORAL_TABLET | Freq: Every day | ORAL | 0 refills | Status: DC

## 2019-01-04 NOTE — Discharge Instructions (Signed)
Coronary Artery Disease, Male ° °Coronary artery disease (CAD) is a condition in which the arteries that lead to the heart (coronary arteries) become narrow or blocked. The narrowing or blockage can lead to decreased blood flow to the heart. Prolonged reduced blood flow can cause a heart attack (myocardial infarction or MI). This condition may also be called coronary heart disease. °Because CAD is the leading cause of death in men, it is important to understand what causes this condition and how it is treated. °What are the causes? °CAD is most often caused by atherosclerosis. This is the buildup of fat and cholesterol (plaque) on the inside of the arteries. Over time, the plaque may narrow or block the artery, reducing blood flow to the heart. Plaque can also become weak and break off within a coronary artery and cause a sudden blockage. Other less common causes of CAD include: °· An embolism or blood clot in a coronary artery. °· A tearing of the artery (spontaneous coronary artery dissection). °· An aneurysm. °· Inflammation (vasculitis) in the artery wall. °What increases the risk? °The following factors may make you more likely to develop this condition: °· Age. Men over age 45 are at a greater risk of CAD. °· Family history of CAD. °· Gender. Men often develop CAD earlier in life than women. °· High blood pressure (hypertension). °· Diabetes. °· High cholesterol levels. °· Tobacco use. °· Excessive alcohol use. °· Lack of exercise. °· A diet high in saturated and trans fats, such as fried food and processed meat. °Other possible risk factors include: °· High stress levels. °· Depression. °· Obesity. °· Sleep apnea. °What are the signs or symptoms? °Many people do not have any symptoms during the early stages of CAD. As the condition progresses, symptoms may include: °· Chest pain (angina). The pain can: °? Feel like a crushing or squeezing, or a tightness, pressure, fullness, or heaviness in the chest. °? Last  more than a few minutes or can stop and recur. The pain tends to get worse with exercise or stress and to fade with rest. °· Pain in the arms, neck, jaw, or back. °· Unexplained heartburn or indigestion. °· Shortness of breath. °· Nausea or vomiting. °· Sudden light-headedness. °· Sudden cold sweats. °· Fluttering or fast heartbeat (palpitations). °How is this diagnosed? °This condition is diagnosed based on: °· Your family and medical history. °· A physical exam. °· Tests, including: °? A test to check the electrical signals in your heart (electrocardiogram). °? Exercise stress test. This looks for signs of blockage when the heart is stressed with exercise, such as running on a treadmill. °? Pharmacologic stress test. This test looks for signs of blockage when the heart is being stressed with a medicine. °? Blood tests. °? Coronary angiogram. This is a procedure to look at the coronary arteries to see if there is any blockage. During this test, a dye is injected into your arteries so they appear on an X-ray. °? A test that uses sound waves to take a picture of your heart (echocardiogram). °? Chest X-ray. °How is this treated? °This condition may be treated by: °· Healthy lifestyle changes to reduce risk factors. °· Medicines such as: °? Antiplatelet medicines and blood-thinning medicines, such as aspirin. These help to prevent blood clots. °? Nitroglycerin. °? Blood pressure medicines. °? Cholesterol-lowering medicine. °· Coronary angioplasty and stenting. During this procedure, a thin, flexible tube is inserted through a blood vessel and into a blocked artery. A balloon   or similar device on the end of the tube is inflated to open up the artery. In some cases, a small, mesh tube (stent) is inserted into the artery to keep it open. °· Coronary artery bypass surgery. During this surgery, veins or arteries from other parts of the body are used to create a bypass around the blockage and allow blood to reach your  heart. °Follow these instructions at home: °Medicines °· Take over-the-counter and prescription medicines only as told by your health care provider. °· Do not take the following medicines unless your health care provider approves: °? NSAIDs, such as ibuprofen, naproxen, or celecoxib. °? Vitamin supplements that contain vitamin A, vitamin E, or both. °Lifestyle °· Follow an exercise program approved by your health care provider. Aim for 150 minutes of moderate exercise or 75 minutes of vigorous exercise each week. °· Maintain a healthy weight or lose weight as approved by your health care provider. °· Rest when you are tired. °· Learn to manage stress or try to limit your stress. Ask your health care provider for suggestions if you need help. °· Get screened for depression and seek treatment, if needed. °· Do not use any products that contain nicotine or tobacco, such as cigarettes and e-cigarettes. If you need help quitting, ask your health care provider. °· Do not use illegal drugs. °Eating and drinking °· Follow a heart-healthy diet. A dietitian can help educate you about healthy food options and changes. In general, eat plenty of fruits and vegetables, lean meats, and whole grains. °· Avoid foods high in: °? Sugar. °? Salt (sodium). °? Saturated fat, such as processed or fatty meat. °? Trans fat, such as fried foods. °· Use healthy cooking methods such as roasting, grilling, broiling, baking, poaching, steaming, or stir-frying. °· If you drink alcohol, and your health care provider approves, limit your alcohol intake to no more than 2 drinks per day. One drink equals 12 ounces of beer, 5 ounces of wine, or 1½ ounces of hard liquor. °General instructions °· Manage any other health conditions, such as hypertension and diabetes. These conditions affect your heart. °· Your health care provider may ask you to monitor your blood pressure. Ideally, your blood pressure should be below 130/80. °· Keep all follow-up visits  as told by your health care provider. This is important. °Get help right away if: °· You have pain in your chest, neck, arm, jaw, stomach, or back that: °? Lasts more than a few minutes. °? Is recurring. °? Is not relieved by taking medicine under your tongue (sublingualnitroglycerin). °· You have too much (profuse) sweating without cause. °· You have unexplained: °? Heartburn or indigestion. °? Shortness of breath or difficulty breathing. °? Fluttering or fast heartbeat (palpitations). °? Nausea or vomiting. °? Fatigue. °? Feelings of nervousness or anxiety. °? Weakness. °? Diarrhea. °· You have sudden light-headedness or dizziness. °· You faint. °· You feel like hurting yourself or think about taking your own life. °These symptoms may represent a serious problem that is an emergency. Do not wait to see if the symptoms will go away. Get medical help right away. Call your local emergency services (911 in the [place] Do not drive yourself to the hospital. °Summary °· Coronary artery disease (CAD) is a process in which the arteries that lead to the heart (coronary arteries) become narrow or blocked. The narrowing or blockage can lead to a heart attack. °· Many people do not have any symptoms during the early stages of CAD.   This is called "silent CAD." °· CAD can be treated with lifestyle changes, medicines, surgery, or a combination of these treatments. °This information is not intended to replace advice given to you by your health care provider. Make sure you discuss any questions you have with your health care provider. °Document Released: 02/22/2014 Document Revised: 07/18/2016 Document Reviewed: 07/18/2016 °Elsevier Interactive Patient Education © 2019 [employer] ° °

## 2019-01-04 NOTE — Progress Notes (Signed)
Ryan Stafford is a 63 y.o. male  [id number]  Primary Cardiologist: Adrian Blackwater Reason for Consultation: Prinzmetal angina with history of CABG  HPI: This is a 63 year old male with a history of CABG presented to the hospital with chest pain and mildly elevated troponin and questionable EKG changes and underwent cardiac catheterization which showed no significant obstructive disease.  Patient already had a echocardiogram in my office which showed normal left ventricular systolic function about approximately 65% and underwent CTA coronaries which showed calcium score 407 with LIMA to the LAD atretic and SVG to distal circumflex patent with nondominant small right coronary artery.  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  Cardiac catheterization done by Dr. Renato Gails has confirmed that.   Review of Systems: No chest pain at this time   Past Medical History:  Diagnosis Date  . Anginal pain (HCC)   . Coronary artery disease   . Depression   . Hypertension   . Myocardial infarction [facility])     Medications Prior to Admission  Medication Sig Dispense Refill  . acetaminophen (TYLENOL) 325 MG tablet Take 650 mg by mouth every 6 (six) hours as needed for mild pain.    Marland Kitchen amLODipine (NORVASC) 5 MG tablet Take 5 mg by mouth daily.     Marland Kitchen aspirin EC 81 MG tablet Take 81 mg by mouth daily.     Marland Kitchen buPROPion (WELLBUTRIN XL) 300 MG 24 hr tablet Take 300 mg by mouth daily.     Marland Kitchen docusate sodium (COLACE) 100 MG capsule Take 100 mg by mouth 2 (two) times daily as needed for mild constipation.     . finasteride (PROSCAR) 5 MG tablet Take 5 mg by mouth daily.    . hydrochlorothiazide (MICROZIDE) 12.5 MG capsule Take 12.5 mg by mouth daily.    . isosorbide mononitrate (IMDUR) 30 MG 24 hr tablet Take 30 mg by mouth daily.    Marland Kitchen losartan (COZAAR) 50 MG tablet Take 50 mg by mouth daily.    .  meloxicam (MOBIC) 7.5 MG tablet Take 7.5 mg by mouth 2 (two) times a day.    . metoprolol succinate (TOPROL-XL) 50 MG 24 hr tablet Take 50 mg by mouth daily.    . Multiple Vitamin (MULTIVITAMIN WITH MINERALS) TABS tablet Take 1 tablet by mouth daily.    . nitroGLYCERIN (NITROSTAT) 0.4 MG SL tablet Place 0.4 mg under the tongue every 5 (five) minutes as needed for chest pain.    . rosuvastatin (CRESTOR) 40 MG tablet Take 40 mg by mouth daily.    . tamsulosin (FLOMAX) 0.4 MG CAPS capsule Take 0.4 mg by mouth daily.     . Vitamin D, Ergocalciferol, (DRISDOL) 1.25 MG (50000 UT) CAPS capsule Take 50,000 Units by mouth once a week.    . zolpidem (AMBIEN) 5 MG tablet Take 5 mg by mouth at bedtime as needed for sleep.       Marland Kitchen amLODipine  5 mg Oral Daily  . aspirin EC  81 mg Oral Daily  . buPROPion  300 mg Oral Daily  . clopidogrel  75 mg Oral Q breakfast  . enoxaparin (LOVENOX) injection  40 mg Subcutaneous Q24H  . finasteride  5 mg Oral Daily  . hydrochlorothiazide  12.5 mg Oral Daily  . isosorbide mononitrate  30 mg Oral Daily  . losartan  50 mg Oral  Daily  . meloxicam  7.5 mg Oral BID  . metoprolol succinate  50 mg Oral Daily  . multivitamin with minerals  1 tablet Oral Daily  . rosuvastatin  40 mg Oral Daily  . sodium chloride flush  3 mL Intravenous Q12H  . tamsulosin  0.4 mg Oral Daily  . [START ON 01/07/2019] Vitamin D (Ergocalciferol)  50,000 Units Oral Weekly    Infusions: . sodium chloride Stopped (01/04/19 0200)  . nitroGLYCERIN Stopped (01/04/19 1610)    No Known Allergies  Social History   Socioeconomic History  . Marital status: Married    Spouse name: Not on file  . Number of children: Not on file  . Years of education: Not on file  . Highest education level: Not on file  Occupational History  . Not on file  Social Needs  . Financial resource strain: Not on file  . Food insecurity:    Worry: Not on file    Inability: Not on file  . Transportation needs:     Medical: Not on file    Non-medical: Not on file  Tobacco Use  . Smoking status: Former Smoker    Packs/day: 1.00    Years: 40.00    Pack years: 40.00    Types: Cigarettes    Last attempt to quit: 04/20/2011    Years since quitting: 7.7  . Smokeless tobacco: Former Engineer, water and Sexual Activity  . Alcohol use: Not Currently  . Drug use: Never  . Sexual activity: Yes  Lifestyle  . Physical activity:    Days per week: Not on file    Minutes per session: Not on file  . Stress: Not on file  Relationships  . Social connections:    Talks on phone: Not on file    Gets together: Not on file    Attends religious service: Not on file    Active member of club or organization: Not on file    Attends meetings of clubs or organizations: Not on file    Relationship status: Not on file  . Intimate partner violence:    Fear of current or ex partner: Not on file    Emotionally abused: Not on file    Physically abused: Not on file    Forced sexual activity: Not on file  Other Topics Concern  . Not on file  Social History Narrative  . Not on file    Family History  Problem Relation Age of Onset  . Hypertension Father   . Hypertension Brother     PHYSICAL EXAM: Vitals:   01/04/19 1000 01/04/19 1100  BP: 130/85 102/74  Pulse: 63 68  Resp: 14 17  Temp:    SpO2: 97% 95%     Intake/Output Summary (Last 24 hours) at 01/04/2019 1141 Last data filed at 01/04/2019 0900 Gross per 24 hour  Intake 873.52 ml  Output 1300 ml  Net -426.48 ml    General:  Well appearing. No respiratory difficulty HEENT: normal Neck: supple. no JVD. Carotids 2+ bilat; no bruits. No lymphadenopathy or thryomegaly appreciated. Cor: PMI nondisplaced. Regular rate & rhythm. No rubs, gallops or murmurs. Lungs: clear Abdomen: soft, nontender, nondistended. No hepatosplenomegaly. No bruits or masses. Good bowel sounds. Extremities: no cyanosis, clubbing, rash, edema Neuro: alert & oriented x 3, cranial  nerves grossly intact. moves all 4 extremities w/o difficulty. Affect pleasant.  ECG: Sinus rhythm with ST elevation in the inferior leads  Results for orders placed or performed during the  hospital encounter of 01/03/19 (from the past 24 hour(s))  Basic metabolic panel     Status: Abnormal   Collection Time: 01/03/19 12:11 PM  Result Value Ref Range   Sodium 137 135 - 145 mmol/L   Potassium 3.9 3.5 - 5.1 mmol/L   Chloride 102 98 - 111 mmol/L   CO2 27 22 - 32 mmol/L   Glucose, Bld 111 (H) 70 - 99 mg/dL   BUN 18 8 - 23 mg/dL   Creatinine, Ser 1.611.32 (H) 0.61 - 1.24 mg/dL   Calcium 9.5 8.9 - 09.610.3 mg/dL   GFR calc non Af Amer 57 (L) >60 mL/min   GFR calc Af Amer >60 >60 mL/min   Anion gap 8 5 - 15  CBC with Differential     Status: Abnormal   Collection Time: 01/03/19 12:11 PM  Result Value Ref Range   WBC 4.1 4.0 - 10.5 K/uL   RBC 4.46 4.22 - 5.81 MIL/uL   Hemoglobin 14.3 13.0 - 17.0 g/dL   HCT 04.542.1 40.939.0 - 81.152.0 %   MCV 94.4 80.0 - 100.0 fL   MCH 32.1 26.0 - 34.0 pg   MCHC 34.0 30.0 - 36.0 g/dL   RDW 91.411.4 (L) 78.211.5 - 95.615.5 %   Platelets 276 150 - 400 K/uL   nRBC 0.0 0.0 - 0.2 %   Neutrophils Relative % 40 %   Neutro Abs 1.6 (L) 1.7 - 7.7 K/uL   Lymphocytes Relative 45 %   Lymphs Abs 1.9 0.7 - 4.0 K/uL   Monocytes Relative 8 %   Monocytes Absolute 0.3 0.1 - 1.0 K/uL   Eosinophils Relative 6 %   Eosinophils Absolute 0.3 0.0 - 0.5 K/uL   Basophils Relative 1 %   Basophils Absolute 0.0 0.0 - 0.1 K/uL   Immature Granulocytes 0 %   Abs Immature Granulocytes 0.01 0.00 - 0.07 K/uL  Troponin I - Once     Status: None   Collection Time: 01/03/19 12:11 PM  Result Value Ref Range   Troponin I <0.03 <0.03 ng/mL  Protime-INR     Status: None   Collection Time: 01/03/19 12:11 PM  Result Value Ref Range   Prothrombin Time 13.2 11.4 - 15.2 seconds   INR 1.0 0.8 - 1.2  APTT     Status: None   Collection Time: 01/03/19 12:11 PM  Result Value Ref Range   aPTT 34 24 - 36 seconds  SARS  Coronavirus 2 (CEPHEID - Performed [facility] lab), Hosp Order     Status: None   Collection Time: 01/03/19  2:00 PM  Result Value Ref Range   SARS Coronavirus 2 NEGATIVE NEGATIVE  Glucose, capillary     Status: None   Collection Time: 01/03/19  3:16 PM  Result Value Ref Range   Glucose-Capillary 99 70 - 99 mg/dL  MRSA PCR Screening     Status: None   Collection Time: 01/03/19  3:26 PM  Result Value Ref Range   MRSA by PCR NEGATIVE NEGATIVE  Urine Drug Screen, Qualitative ([facility] only)     Status: Abnormal   Collection Time: 01/03/19  5:18 PM  Result Value Ref Range   Tricyclic, Ur Screen NONE DETECTED NONE DETECTED   Amphetamines, Ur Screen NONE DETECTED NONE DETECTED   MDMA (Ecstasy)Ur Screen NONE DETECTED NONE DETECTED   Cocaine Metabolite,Ur Lily NONE DETECTED NONE DETECTED   Opiate, Ur Screen NONE DETECTED NONE DETECTED   Phencyclidine (PCP) Ur S NONE DETECTED NONE DETECTED  Cannabinoid 50 Ng, Ur [place] NONE DETECTED NONE DETECTED   Barbiturates, Ur Screen NONE DETECTED NONE DETECTED   Benzodiazepine, Ur Scrn POSITIVE (A) NONE DETECTED   Methadone Scn, Ur NONE DETECTED NONE DETECTED  CBC     Status: Abnormal   Collection Time: 01/04/19  4:20 AM  Result Value Ref Range   WBC 5.1 4.0 - 10.5 K/uL   RBC 4.14 (L) 4.22 - 5.81 MIL/uL   Hemoglobin 13.3 13.0 - 17.0 g/dL   HCT 52.8 41.3 - 24.4 %   MCV 97.1 80.0 - 100.0 fL   MCH 32.1 26.0 - 34.0 pg   MCHC 33.1 30.0 - 36.0 g/dL   RDW 01.0 27.2 - 53.6 %   Platelets 266 150 - 400 K/uL   nRBC 0.0 0.0 - 0.2 %  Troponin I - Once     Status: Abnormal   Collection Time: 01/04/19  8:26 AM  Result Value Ref Range   Troponin I 0.06 (HH) <0.03 ng/mL   Dg Chest 2 View  Result Date: 01/03/2019 CLINICAL DATA:  Epigastric and lower chest pain starting this morning. EXAM: CHEST - 2 VIEW COMPARISON:  Chest x-ray dated 12/21/2011. FINDINGS: Surgical changes of a presumed CABG. Median sternotomy wires appear intact and appropriately aligned.  Heart size and mediastinal contours are within normal limits. Lungs are clear. No pleural effusion or pneumothorax seen. No acute or suspicious osseous finding. IMPRESSION: No active cardiopulmonary disease. No evidence of pneumonia or pulmonary edema. Electronically Signed   By: Bary Richard M.D.   On: 01/03/2019 12:53     ASSESSMENT AND PLAN: Prinzmetal angina with spasm in the distal left circumflex which does have patent graft going to it and has small right coronary and moderate disease in the LAD.  He was already on amlodipine and isosorbide 30 mg once a day at home advised changing that to isosorbide 30 mg twice a day and continue aspirin and Plavix.  Patient does not need an echocardiogram has normal left ventricular systolic function had no significant elevation in the troponin.  Patient can be discharged with follow-up in my office on Thursday at 10:00.  Patient has agreed to this plan.  Kammie Scioli A

## 2019-01-04 NOTE — Progress Notes (Signed)
Ryan Stafford to be D/C'd Home per MD. Discussed with the patient and all questions fully answered.   VSS, skin clean, dry and intact without evidence of skin break down, no evidence of skin tears noted.   IV catheters discontinued intact. Site without signs and symptoms of complications. Dressing and pressure applied.   An after visit summary was printed and given to the patient. Patient received incentive spirometer and prescriptions sent to pharmacy.   D/c education completed with patient/family including follow up instructions, medications list, d/c activities limitations if indicated, with other d/c instructions as indicated by MD- patient able to verbalize understanding, all questions fully answered.   Patient instructed to return to ED, call 911, or Call MD for any changes in condition.  Patient escorted via [facility] and D/C home via private auto.

## 2019-01-04 NOTE — Progress Notes (Signed)
*  PRELIMINARY RESULTS* Echocardiogram 2D Echocardiogram has been performed.  Ryan Stafford Meylin Stenzel 01/04/2019, 9:44 AM

## 2019-01-04 NOTE — Progress Notes (Signed)
CRITICAL CARE NOTE       SUBJECTIVE FINDINGS & SIGNIFICANT EVENTS    Overnight events noted.  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 Anginal pain (HCC)   . Coronary artery disease   . Depression   . Hypertension   . Myocardial infarction [facility])      SURGICAL HISTORY   Past Surgical History:  Procedure Laterality Date  . CARDIAC CATHETERIZATION N/A 04/20/2015   Procedure: Left Heart Cath and Cors/Grafts Angiography;  Surgeon: Marcina Millard, MD;  Location: [facility] INVASIVE CV LAB;  Service: Cardiovascular;  Laterality: N/A;  . CORONARY ARTERY BYPASS GRAFT    . CORONARY/GRAFT ACUTE MI REVASCULARIZATION N/A 01/03/2019   Procedure: GRAFT ANGIOGRAPHY;  Surgeon: Iran Ouch, MD;  Location: [facility] INVASIVE CV LAB;  Service: Cardiovascular;  Laterality: N/A;  . LEFT HEART CATH AND CORONARY ANGIOGRAPHY N/A 01/03/2019   Procedure: LEFT HEART CATH AND CORONARY ANGIOGRAPHY;  Surgeon: Iran Ouch, MD;  Location: [facility] INVASIVE CV LAB;  Service: Cardiovascular;  Laterality: N/A;     FAMILY HISTORY   Family History  Problem Relation Age of Onset  . Hypertension Father   . Hypertension Brother      SOCIAL HISTORY   Social History   Tobacco Use  . Smoking status: Former Smoker    Packs/day: 1.00    Years: 40.00    Pack years: 40.00    Types: Cigarettes    Last attempt to quit: 04/20/2011    Years since quitting: 7.7  . Smokeless tobacco: Former Engineer, water Use Topics  . Alcohol use: Not Currently  . Drug use: Never     MEDICATIONS   Current Medication:  Current Facility-Administered Medications:  .  0.9 %  sodium chloride infusion, 250 mL, Intravenous, PRN, Iran Ouch, MD,  Stopped at 01/04/19 0200 .  acetaminophen (TYLENOL) tablet 650 mg, 650 mg, Oral, Q6H PRN, Arida, Muhammad A, MD .  amLODipine (NORVASC) tablet 5 mg, 5 mg, Oral, Daily, Arida, Muhammad A, MD, 5 mg at 01/04/19 0818 .  aspirin EC tablet 81 mg, 81 mg, Oral, Daily, Lorine Bears A, MD, 81 mg at 01/04/19 1610 .  buPROPion (WELLBUTRIN XL) 24 hr tablet 300 mg, 300 mg, Oral, Daily, Lorine Bears A, MD, 300 mg at 01/04/19 0819 .  clopidogrel (PLAVIX) tablet 75 mg, 75 mg, Oral, Q breakfast, Lorine Bears A, MD, 75 mg at 01/04/19 0817 .  docusate sodium (COLACE) capsule 100 mg, 100 mg, Oral, BID PRN, Kirke Corin, Muhammad A, MD .  enoxaparin (LOVENOX) injection 40 mg, 40 mg, Subcutaneous, Q24H, Arida, Muhammad A, MD, 40 mg at 01/04/19 0825 .  finasteride (PROSCAR) tablet 5 mg, 5 mg, Oral, Daily, Arida, Muhammad A, MD, 5 mg at 01/04/19 9604 .  hydrochlorothiazide (MICROZIDE) capsule 12.5 mg, 12.5 mg, Oral, Daily, Kirke Corin, Muhammad A, MD, 12.5 mg at 01/04/19 0820 .  isosorbide mononitrate (IMDUR) 24 hr tablet 30 mg, 30 mg, Oral, Daily, Lorine Bears A, MD, 30 mg at 01/04/19 0814 .  losartan (COZAAR) tablet 50 mg, 50 mg, Oral, Daily, Lorine Bears A, MD, 50 mg at 01/04/19 5409 .  meloxicam (MOBIC) tablet 7.5 mg, 7.5 mg, Oral, BID, Arida, Muhammad A, MD, 7.5 mg at 01/03/19 2101 .  metoprolol succinate (TOPROL-XL) 24 hr tablet 50 mg, 50 mg, Oral, Daily, Arida, Muhammad A, MD .  multivitamin with minerals tablet 1 tablet, 1 tablet,  Oral, Daily, Iran OuchArida, Muhammad A, MD, 1 tablet at 01/04/19 [id number] .  nitroGLYCERIN (NITROSTAT) SL tablet 0.4 mg, 0.4 mg, Sublingual, Q5 min PRN, Arida, Muhammad A, MD .  nitroGLYCERIN 50 mg in dextrose 5 % 250 mL (0.2 mg/mL) infusion, 0-200 mcg/min, Intravenous, Titrated, Iran OuchArida, Muhammad A, MD, Stopped at 01/04/19 [phone] .  ondansetron (ZOFRAN) injection 4 mg, 4 mg, Intravenous, Q6H PRN, Arida, Muhammad A, MD .  rosuvastatin (CRESTOR) tablet 40 mg, 40 mg, Oral, Daily, Kirke CorinArida, Muhammad A, MD,  40 mg at 01/03/19 2100 .  sodium chloride flush (NS) 0.9 % injection 3 mL, 3 mL, Intravenous, Q12H, Arida, Muhammad A, MD, 3 mL at 01/04/19 0825 .  sodium chloride flush (NS) 0.9 % injection 3 mL, 3 mL, Intravenous, PRN, Arida, Muhammad A, MD .  tamsulosin (FLOMAX) capsule 0.4 mg, 0.4 mg, Oral, Daily, Arida, Muhammad A, MD, 0.4 mg at 01/03/19 2101 .  [START ON 01/07/2019] Vitamin D (Ergocalciferol) (DRISDOL) capsule 50,000 Units, 50,000 Units, Oral, Weekly, Arida, Muhammad A, MD .  zolpidem (AMBIEN) tablet 5 mg, 5 mg, Oral, QHS PRN, Iran OuchArida, Muhammad A, MD    ALLERGIES   Patient has no known allergies.    REVIEW OF SYSTEMS    10 point ROS is conducted and is negative except as per subjective findings 10 point ROS conducted and is negative except as per HPI and subjective findings  PHYSICAL EXAMINATION   Vitals:   01/04/19 0900 01/04/19 1000  BP: 119/79 130/85  Pulse: 62 63  Resp: 18 14  Temp: 98.2 F (36.8 C)   SpO2: 94% 97%    GENERAL: No apparent distress HEAD: Normocephalic, atraumatic.  EYES: Pupils equal, round, reactive to light.  No scleral icterus.  MOUTH: Moist mucosal membrane. NECK: Supple. No thyromegaly. No nodules. No JVD.  PULMONARY: Clear to auscultation CARDIOVASCULAR: S1 and S2. Regular rate and rhythm. No murmurs, rubs, or gallops.  GASTROINTESTINAL: Soft, nontender, non-distended. No masses. Positive bowel sounds. No hepatosplenomegaly.  MUSCULOSKELETAL: No swelling, clubbing, or edema.  NEUROLOGIC: Mild distress due to acute illness SKIN:intact,warm,dry   LABS AND IMAGING     LAB RESULTS: Recent Labs  Lab 01/03/19 1211  NA 137  K 3.9  CL 102  CO2 27  BUN 18  CREATININE 1.32*  GLUCOSE 111*   Recent Labs  Lab 01/03/19 1211 01/04/19 0420  HGB 14.3 13.3  HCT 42.1 40.2  WBC 4.1 5.1  PLT 276 266     IMAGING RESULTS: Dg Chest 2 View  Result Date: 01/03/2019 CLINICAL DATA:  Epigastric and lower chest pain starting this morning.  EXAM: CHEST - 2 VIEW COMPARISON:  Chest x-ray dated 12/21/2011. FINDINGS: Surgical changes of a presumed CABG. Median sternotomy wires appear intact and appropriately aligned. Heart size and mediastinal contours are within normal limits. Lungs are clear. No pleural effusion or pneumothorax seen. No acute or suspicious osseous finding. IMPRESSION: No active cardiopulmonary disease. No evidence of pneumonia or pulmonary edema. Electronically Signed   By: Bary RichardStan  Maynard M.D.   On: 01/03/2019 12:53      ASSESSMENT AND PLAN    -Multidisciplinary rounds held today     CARDIAC FAILURE- spasm in the left circumflex distribution as the culprit for his symptoms Cardio on case - appreciate input Plan for downgrade from MICU -oxygen as needed -Lasix as tolerated -follow up cardiac enzymes as indicated ICU monitoring  GI/Nutrition GI PROPHYLAXIS as indicated DIET-->TF's as tolerated Constipation protocol as indicated  ENDO - ICU hypoglycemic\Hyperglycemia protocol -check FSBS per  protocol   ELECTROLYTES -follow labs as needed -replace as needed -pharmacy consultation   DVT/GI PRX ordered -SCDs  TRANSFUSIONS AS NEEDED MONITOR FSBS ASSESS the need for LABS as needed   This document was prepared using Dragon voice recognition software and may include unintentional dictation errors.    Vida Rigger, M.D.  Division of Pulmonary & Critical Care Medicine  [facility]

## 2019-01-04 NOTE — Progress Notes (Signed)
The patient had recurrent episode of chest pain yesterday after cardiac catheterization with EKG changes noted on the monitor and frequent PVCs.  The symptoms subsided within 5 to 10 minutes with resolution of EKG changes.  This is exactly what he did in the emergency department before his cardiac catheterization.  It was captured on EKG in the ED and at that time he had significant inferolateral ST elevation with posterior involvement.  This raises suspicion for significant spasm in the left circumflex distribution as the culprit for his symptoms. 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 The patient presentation might be all due to significant coronary spasm in the left circumflex.  He was placed on nitroglycerin drip yesterday with no further episodes. Recommend intensifying medical therapy for presumed Prinzmetal angina.  The patient's primary cardiologist is Dr. Welton Flakes which should be consulted on the patient.

## 2019-01-06 NOTE — Discharge Summary (Signed)
[employer] [facility] [place] [facility]   PATIENT NAME: Ryan Stafford    MR#:  [id number]  DATE OF BIRTH:  October 13, 1955  DATE OF ADMISSION:  01/03/2019   ADMITTING PHYSICIAN: Houston Siren, MD  DATE OF DISCHARGE: 01/04/2019  5:32 PM  PRIMARY CARE PHYSICIAN: Evelene Croon, MD   ADMISSION DIAGNOSIS:  Acute coronary syndrome (HCC) [I24.9] Acute ST elevation myocardial infarction (STEMI) of inferolateral wall (HCC) [I21.19] DISCHARGE DIAGNOSIS:  Active Problems:   Acute ST elevation myocardial infarction (STEMI) of inferolateral wall (HCC)  SECONDARY DIAGNOSIS:   Past Medical History:  Diagnosis Date  . Anginal pain (HCC)   . Coronary artery disease   . Depression   . Hypertension   . Myocardial infarction [facility])    HOSPITAL COURSE:  This is a 63 year old male with a history of CAD s/p CABG, HTN, HL admitted to the hospital with chest pain and mildly elevated troponin and questionable EKG changes   * Inferolateral ST elevation myocardial infarction:  -Underwent cardiac catheterization which showed no significant obstructive disease.   - Per cardio, his presentation might be all due to significant coronary spasm in the left circumflex.  Recommend intensifying medical therapy for presumed Prinzmetal angina. increase imdur dose to BIDper cardio 1. Hyperlipidemia: Continue high-dose rosuvastatin. 2. Essential hypertension: Blood pressure is controlled.  DISCHARGE CONDITIONS:  stable CONSULTS OBTAINED:  Treatment Team:  Pccm, Raymond Gurney, MD Laurier Nancy, MD DRUG ALLERGIES:  No Known Allergies DISCHARGE MEDICATIONS:   Allergies as of 01/04/2019   No Known Allergies     Medication List    TAKE these medications   acetaminophen 325 MG tablet Commonly known as:  TYLENOL Take 650 mg by mouth every 6 (six) hours as needed for mild pain.   amLODipine 5 MG tablet Commonly known as:  NORVASC Take 5 mg by mouth daily.   aspirin EC 81 MG tablet  Take 81 mg by mouth daily.   buPROPion 300 MG 24 hr tablet Commonly known as:  WELLBUTRIN XL Take 300 mg by mouth daily.   clopidogrel 75 MG tablet Commonly known as:  PLAVIX Take 1 tablet (75 mg total) by mouth daily with breakfast.   docusate sodium 100 MG capsule Commonly known as:  COLACE Take 100 mg by mouth 2 (two) times daily as needed for mild constipation.   finasteride 5 MG tablet Commonly known as:  PROSCAR Take 5 mg by mouth daily.   hydrochlorothiazide 12.5 MG capsule Commonly known as:  MICROZIDE Take 12.5 mg by mouth daily.   isosorbide mononitrate 30 MG 24 hr tablet Commonly known as:  IMDUR Take 1 tablet (30 mg total) by mouth 2 (two) times daily. What changed:  when to take this   losartan 50 MG tablet Commonly known as:  COZAAR Take 50 mg by mouth daily.   meloxicam 7.5 MG tablet Commonly known as:  MOBIC Take 7.5 mg by mouth 2 (two) times a day.   metoprolol succinate 50 MG 24 hr tablet Commonly known as:  TOPROL-XL Take 50 mg by mouth daily.   multivitamin with minerals Tabs tablet Take 1 tablet by mouth daily.   nitroGLYCERIN 0.4 MG SL tablet Commonly known as:  NITROSTAT Place 0.4 mg under the tongue every 5 (five) minutes as needed for chest pain.   rosuvastatin 40 MG tablet Commonly known as:  CRESTOR Take 40 mg by mouth daily.   tamsulosin 0.4 MG Caps capsule Commonly known as:  FLOMAX Take 0.4 mg by  mouth daily.   Vitamin D (Ergocalciferol) 1.25 MG (50000 UT) Caps capsule Commonly known as:  DRISDOL Take 50,000 Units by mouth once a week.   zolpidem 5 MG tablet Commonly known as:  AMBIEN Take 5 mg by mouth at bedtime as needed for sleep.        DISCHARGE INSTRUCTIONS:   DIET:  Cardiac diet DISCHARGE CONDITION:  Good ACTIVITY:  Activity as tolerated OXYGEN:  Home Oxygen: No.  Oxygen Delivery: room air DISCHARGE LOCATION:  home   If you experience worsening of your admission symptoms, develop shortness of  breath, life threatening emergency, suicidal or homicidal thoughts you must seek medical attention immediately by calling 911 or calling your MD immediately  if symptoms less severe.  You Must read complete instructions/literature along with all the possible adverse reactions/side effects for all the Medicines you take and that have been prescribed to you. Take any new Medicines after you have completely understood and accpet all the possible adverse reactions/side effects.   Please note  You were cared for by a hospitalist during your hospital stay. If you have any questions about your discharge medications or the care you received while you were in the hospital after you are discharged, you can call the unit and asked to speak with the hospitalist on call if the hospitalist that took care of you is not available. Once you are discharged, your primary care physician will handle any further medical issues. Please note that NO REFILLS for any discharge medications will be authorized once you are discharged, as it is imperative that you return to your primary care physician (or establish a relationship with a primary care physician if you do not have one) for your aftercare needs so that they can reassess your need for medications and monitor your lab values.    On the day of Discharge:  VITAL SIGNS:  Blood pressure 104/71, pulse 63, temperature 98.4 F (36.9 C), temperature source Oral, resp. rate 17, height 6' (1.829 m), weight 89.8 kg, SpO2 95 %. PHYSICAL EXAMINATION:  GENERAL:  63 y.o.-year-old patient lying in the bed with no acute distress.  EYES: Pupils equal, round, reactive to light and accommodation. No scleral icterus. Extraocular muscles intact.  HEENT: Head atraumatic, normocephalic. Oropharynx and nasopharynx clear.  NECK:  Supple, no jugular venous distention. No thyroid enlargement, no tenderness.  LUNGS: Normal breath sounds bilaterally, no wheezing, rales,rhonchi or crepitation. No  use of accessory muscles of respiration.  CARDIOVASCULAR: S1, S2 normal. No murmurs, rubs, or gallops.  ABDOMEN: Soft, non-tender, non-distended. Bowel sounds present. No organomegaly or mass.  EXTREMITIES: No pedal edema, cyanosis, or clubbing.  NEUROLOGIC: Cranial nerves II through XII are intact. Muscle strength 5/5 in all extremities. Sensation intact. Gait not checked.  PSYCHIATRIC: The patient is alert and oriented x 3.  SKIN: No obvious rash, lesion, or ulcer.  DATA REVIEW:   CBC Recent Labs  Lab 01/04/19 0420  WBC 5.1  HGB 13.3  HCT 40.2  PLT 266    Chemistries  Recent Labs  Lab 01/03/19 1211  NA 137  K 3.9  CL 102  CO2 27  GLUCOSE 111*  BUN 18  CREATININE 1.32*  CALCIUM 9.5     Microbiology Results  Results for orders placed or performed during the hospital encounter of 01/03/19  SARS Coronavirus 2 (CEPHEID - Performed in [facility] hospital lab), Hosp Order     Status: None   Collection Time: 01/03/19  2:00 PM  Result Value Ref  Range Status   SARS Coronavirus 2 NEGATIVE NEGATIVE Final    Comment: (NOTE) If result is NEGATIVE SARS-CoV-2 target nucleic acids are NOT DETECTED. The SARS-CoV-2 RNA is generally detectable in upper and lower  respiratory specimens during the acute phase of infection. The lowest  concentration of SARS-CoV-2 viral copies this assay can detect is 250  copies / mL. A negative result does not preclude SARS-CoV-2 infection  and should not be used as the sole basis for treatment or other  patient management decisions.  A negative result may occur with  improper specimen collection / handling, submission of specimen other  than nasopharyngeal swab, presence of viral mutation(s) within the  areas targeted by this assay, and inadequate number of viral copies  (<250 copies / mL). A negative result must be combined with clinical  observations, patient history, and epidemiological information. If result is POSITIVE SARS-CoV-2 target  nucleic acids are DETECTED. The SARS-CoV-2 RNA is generally detectable in upper and lower  respiratory specimens dur ing the acute phase of infection.  Positive  results are indicative of active infection with SARS-CoV-2.  Clinical  correlation with patient history and other diagnostic information is  necessary to determine patient infection status.  Positive results do  not rule out bacterial infection or co-infection with other viruses. If result is PRESUMPTIVE POSTIVE SARS-CoV-2 nucleic acids MAY BE PRESENT.   A presumptive positive result was obtained on the submitted specimen  and confirmed on repeat testing.  While 2019 novel coronavirus  (SARS-CoV-2) nucleic acids may be present in the submitted sample  additional confirmatory testing may be necessary for epidemiological  and / or clinical management purposes  to differentiate between  SARS-CoV-2 and other Sarbecovirus currently known to infect humans.  If clinically indicated additional testing with an alternate test  methodology [phone]) is advised. The SARS-CoV-2 RNA is generally  detectable in upper and lower respiratory sp ecimens during the acute  phase of infection. The expected result is Negative. Fact Sheet for Patients:  BoilerBrush.com.cy Fact Sheet for Healthcare Providers: https://pope.com/ This test is not yet approved or cleared by the [place] FDA and has been authorized for detection and/or diagnosis of SARS-CoV-2 by FDA under an Emergency Use Authorization (EUA).  This EUA will remain in effect (meaning this test can be used) for the duration of the COVID-19 declaration under Section 564(b)(1) of the Act, 21 U.S.C. section 360bbb-3(b)(1), unless the authorization is terminated or revoked sooner. Performed at [facility], [address] [place], [place] [address]   MRSA PCR Screening     Status: None   Collection Time: 01/03/19  3:26 PM   Result Value Ref Range Status   MRSA by PCR NEGATIVE NEGATIVE Final    Comment:        The GeneXpert MRSA Assay (FDA approved for NASAL specimens only), is one component of a comprehensive MRSA colonization surveillance program. It is not intended to diagnose MRSA infection nor to guide or monitor treatment for MRSA infections. Performed at [facility], [address] [place], [place] [address]     RADIOLOGY:  No results found.   Management plans discussed with the patient, family and they are in agreement.  CODE STATUS: Prior   TOTAL TIME TAKING CARE OF THIS PATIENT: 45 minutes.    Delfino Lovett M.D on 01/06/2019 at 7:26 PM  Between 7am to 6pm - Pager - [phone]  After 6pm go to [email or web address] - Therapist, nutritional Hospitalists  Office  [phone]  CC: Primary care physician; Evelene Croon, MD   Note: This dictation was prepared with Dragon dictation along with smaller phrase technology. Any transcriptional errors that result from this process are unintentional.

## 2019-02-25 ENCOUNTER — Other Ambulatory Visit: Payer: Self-pay

## 2019-02-25 ENCOUNTER — Other Ambulatory Visit: Payer: BLUE CROSS/BLUE SHIELD

## 2019-02-25 DIAGNOSIS — Z20822 Contact with and (suspected) exposure to covid-19: Secondary | ICD-10-CM

## 2019-03-02 LAB — NOVEL CORONAVIRUS, NAA: SARS-CoV-2, NAA: NOT DETECTED

## 2019-03-04 ENCOUNTER — Other Ambulatory Visit: Payer: Self-pay

## 2019-03-04 DIAGNOSIS — Z20822 Contact with and (suspected) exposure to covid-19: Secondary | ICD-10-CM

## 2019-03-07 ENCOUNTER — Telehealth: Payer: Self-pay

## 2019-03-07 LAB — NOVEL CORONAVIRUS, NAA: SARS-CoV-2, NAA: DETECTED — AB

## 2019-03-07 NOTE — Telephone Encounter (Signed)
Call to provide provide covid results.  Voiced understanding.

## 2019-07-27 ENCOUNTER — Other Ambulatory Visit: Payer: Self-pay

## 2019-07-27 ENCOUNTER — Ambulatory Visit: Payer: BLUE CROSS/BLUE SHIELD | Attending: Internal Medicine

## 2019-07-27 DIAGNOSIS — Z20822 Contact with and (suspected) exposure to covid-19: Secondary | ICD-10-CM

## 2019-07-28 LAB — NOVEL CORONAVIRUS, NAA: SARS-CoV-2, NAA: NOT DETECTED

## 2019-08-01 ENCOUNTER — Telehealth: Payer: Self-pay

## 2019-08-01 NOTE — Telephone Encounter (Signed)

## 2020-03-03 IMAGING — CR CHEST - 2 VIEW
2 series · 2 of 2 positions shown · non-contrast
Comparison: Chest x-ray dated 12/21/2011.

CLINICAL DATA: Epigastric and lower chest pain starting this
morning.

EXAM:
CHEST - 2 VIEW

[chest pa]
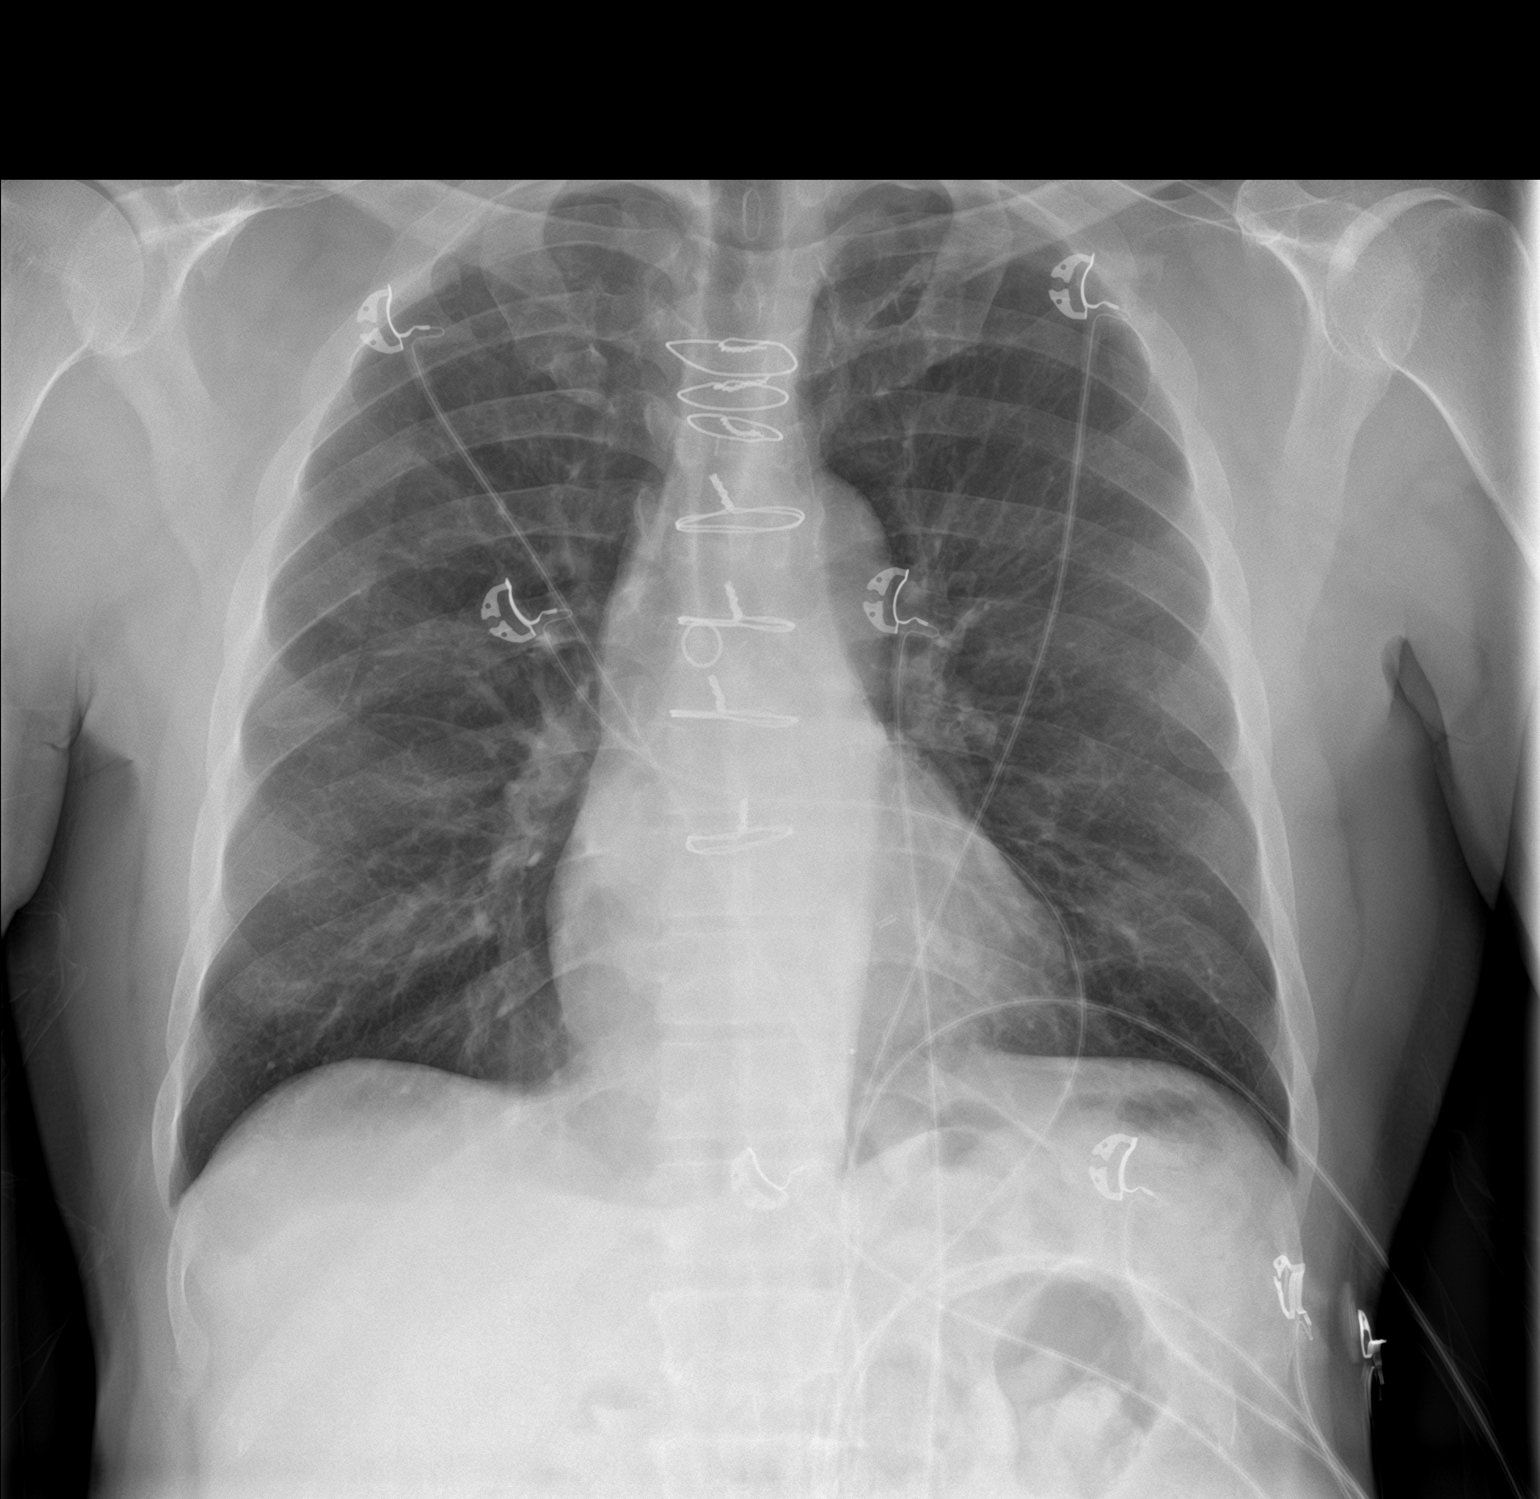

[chest lat]
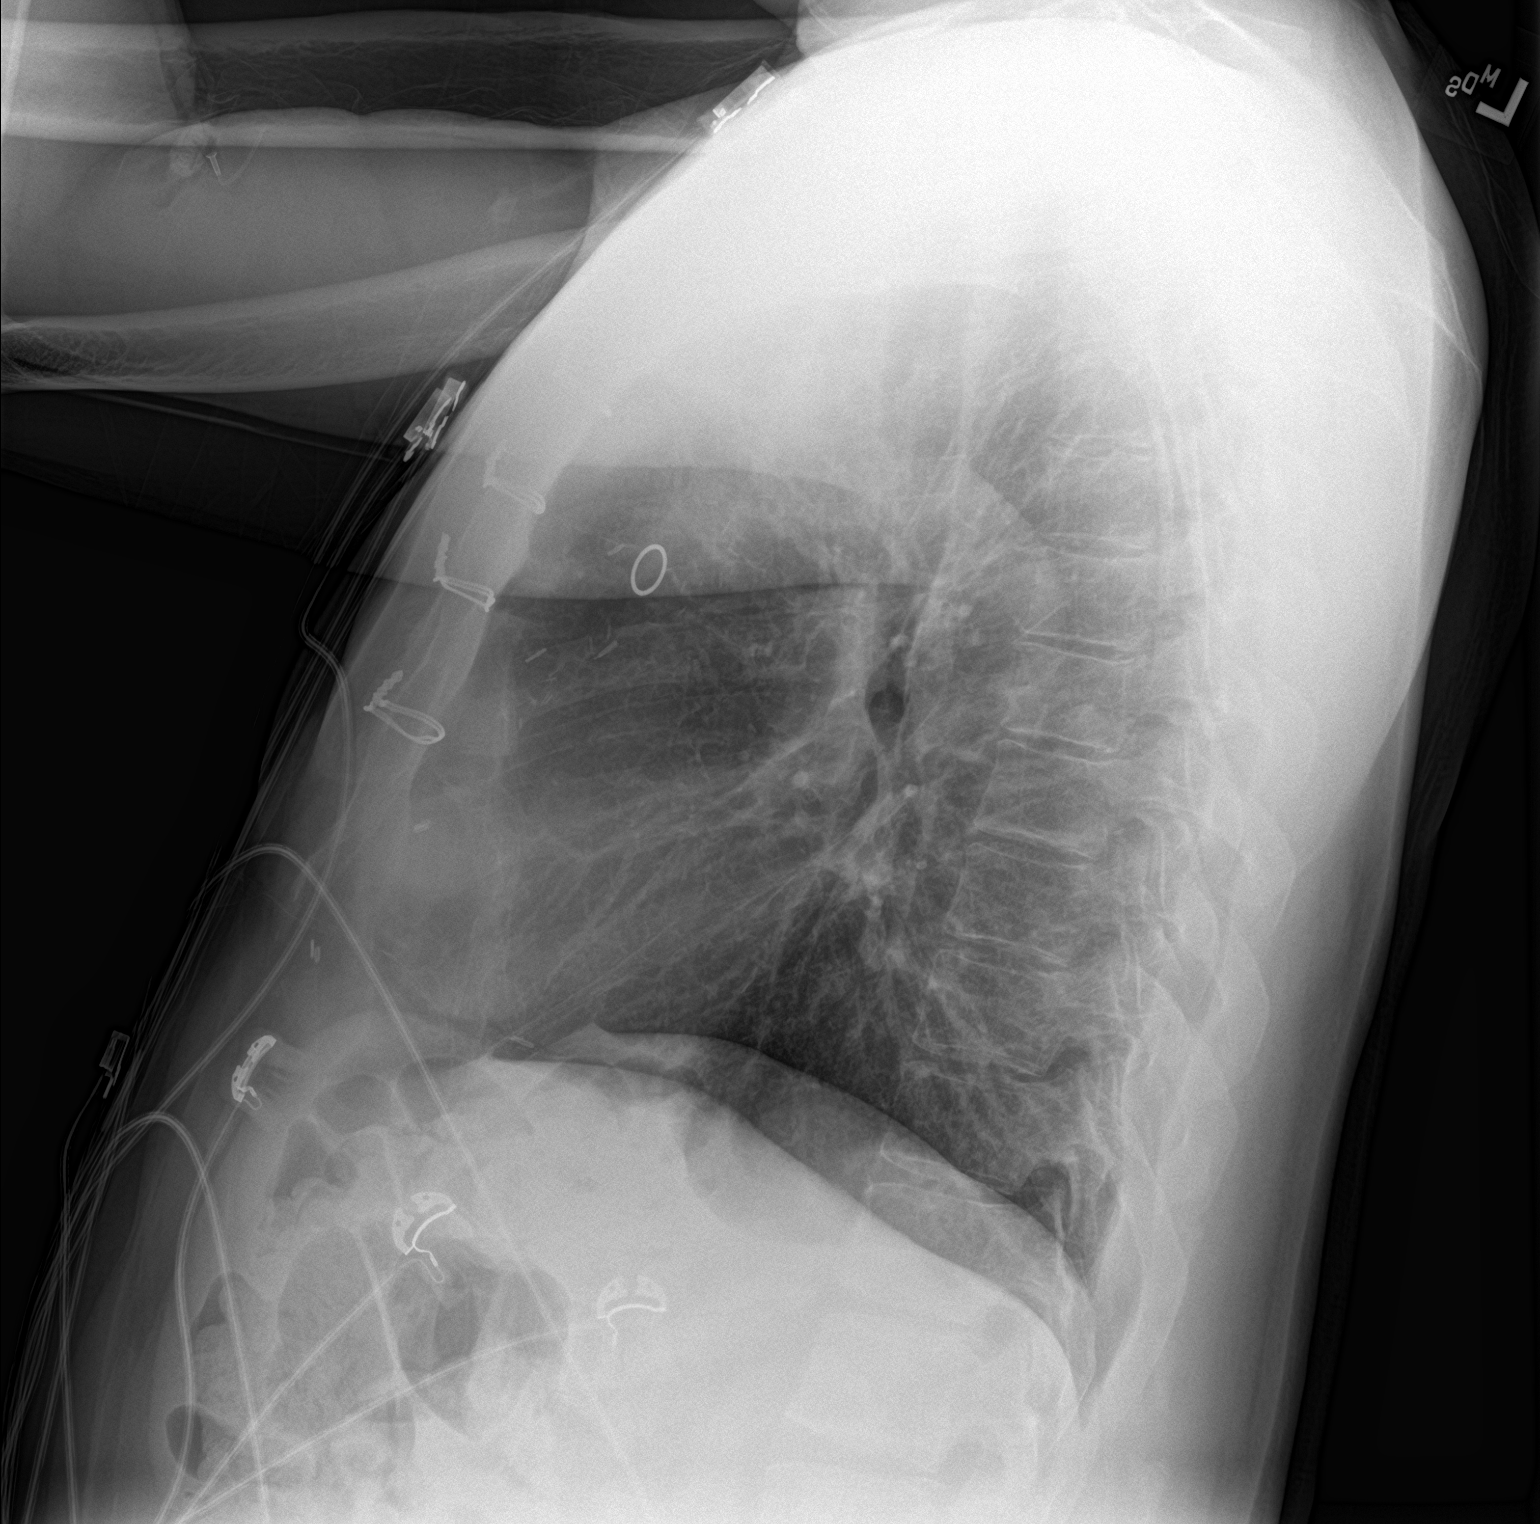

[2 of 2 positions shown; findings below may reference images not displayed]

FINDINGS: Surgical changes of a presumed CABG. Median sternotomy wires appear
intact and appropriately aligned. Heart size and mediastinal
contours are within normal limits. Lungs are clear. No pleural
effusion or pneumothorax seen. No acute or suspicious osseous
finding.
IMPRESSION: No active cardiopulmonary disease. No evidence of pneumonia or
pulmonary edema.

## 2022-08-25 DIAGNOSIS — I340 Nonrheumatic mitral (valve) insufficiency: Secondary | ICD-10-CM | POA: Diagnosis not present

## 2022-09-01 DIAGNOSIS — I340 Nonrheumatic mitral (valve) insufficiency: Secondary | ICD-10-CM | POA: Diagnosis not present

## 2022-09-01 DIAGNOSIS — I10 Essential (primary) hypertension: Secondary | ICD-10-CM | POA: Diagnosis not present

## 2022-09-01 DIAGNOSIS — I25810 Atherosclerosis of coronary artery bypass graft(s) without angina pectoris: Secondary | ICD-10-CM | POA: Diagnosis not present

## 2022-09-01 DIAGNOSIS — E782 Mixed hyperlipidemia: Secondary | ICD-10-CM | POA: Diagnosis not present

## 2022-09-01 DIAGNOSIS — I2510 Atherosclerotic heart disease of native coronary artery without angina pectoris: Secondary | ICD-10-CM | POA: Diagnosis not present

## 2022-09-01 DIAGNOSIS — I351 Nonrheumatic aortic (valve) insufficiency: Secondary | ICD-10-CM | POA: Diagnosis not present

## 2022-11-24 ENCOUNTER — Other Ambulatory Visit: Payer: Self-pay | Admitting: Cardiovascular Disease

## 2022-12-25 ENCOUNTER — Telehealth: Payer: Self-pay | Admitting: Family Medicine

## 2022-12-25 NOTE — Progress Notes (Signed)
I,Ryan  Stafford,acting as a Neurosurgeon for [employer], Stafford.,have documented all relevant documentation on the behalf of Ryan Stafford,as directed by  Ryan Stafford while in the presence of Ryan Stafford.   New patient visit   Patient: Ryan Stafford   DOB: 1956-03-15   67 y.o. Male  MRN: [id number] Visit Date: 12/26/2022  Today's healthcare provider: Ronnald Ramp, Stafford   Chief Complaint  Patient presents with   New Patient (Initial Visit)   Hypertension   Hyperlipidemia   Coronary Artery Disease   Subjective    Ryan Stafford is a 68 y.o. male who presents today as a new patient to establish care.   HPI Patient has no questions or concerns today, other than wanting to get checked up. He is transferring care from [facility].  Encounter to Establish Care Patient presents to establish care  Introduced myself and my role as primary care physician  We reviewed patient's medical, surgical, and social history and medications as listed below    PMHX   Last annual physical: estimates around Nov 2023   CAD with Angina  Hx of MI  Medications: ASA 81mg , Imdur 30mg  BID, Plavix 75mg  dail, Nitroglycerin 0.4mg , on crestor 40mg  daily Follows with Dr. Welton Stafford at [facility]  Asymptomatic today   HTN  Medications: Amlodipine 5mg , losartan 50mg  daily and metoprolol 50mg  once daily Reports that he had roasted pork last night  States that normally has BP within normal range, around 120s-130s systolic  Denies HA, blurry vision, chest pain today    Depression? 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   Overview: WITH LIMA TO THE DISTAL LAD, SVD TO RAMUS, SVG TO PL AND SVG TO PDA AT [facility]   S/P cardiac catheterization 12/12/2011   Overview: 3 VESSEL CAD   Past Surgical History:  Procedure Laterality Date   CARDIAC CATHETERIZATION N/A 04/20/2015   Procedure: Left Heart Cath and Cors/Grafts Angiography;  Surgeon: Ryan Millard, Stafford;  Location: [facility] INVASIVE CV LAB;  Service: Cardiovascular;  Laterality: N/A;   CORONARY ARTERY BYPASS GRAFT     CORONARY/GRAFT ACUTE MI REVASCULARIZATION N/A 01/03/2019   Procedure: GRAFT ANGIOGRAPHY;  Surgeon: Ryan Ouch, Stafford;  Location: [facility] INVASIVE CV LAB;  Service: Cardiovascular;  Laterality: N/A;   LEFT HEART CATH AND CORONARY ANGIOGRAPHY N/A 01/03/2019   Procedure: LEFT HEART CATH AND CORONARY ANGIOGRAPHY;  Surgeon: Ryan Ouch, Stafford;  Location: [facility] INVASIVE CV LAB;  Service: Cardiovascular;  Laterality: N/A;   Family Status  Relation Name Status   Mother  Deceased   Father  Deceased   Brother  Alive   Family History  Problem Relation Age of Onset   Hypertension Father    Hypertension Brother    Social History   Socioeconomic History   Marital status: Married  Spouse name: Ryan Stafford   Number of children: 1   Years of education: Not on file   Highest education level: Not on file  Occupational History   Occupation: retired    Comment: retired from [employer] in [employer], 2022  Tobacco Use   Smoking status: Former    Packs/day: 1.00    Years: 40.00    Additional pack years: 0.00    Total pack years: 40.00    Types: Cigarettes    Quit date: 04/20/2011    Years since quitting: 11.6   Smokeless tobacco: Former  Substance and Sexual Activity   Alcohol use: Not Currently   Drug  use: Never   Sexual activity: Yes  Other Topics Concern   Not on file  Social History Narrative   Not on file   Social Determinants of Health   Financial Resource Strain: Not on file  Food Insecurity: Not on file  Transportation Needs: Not on file  Physical Activity: Not on file  Stress: Not on file  Social Connections: Not on file   Outpatient Medications Prior to Visit  Medication Sig   amLODipine (NORVASC) 5 MG tablet Take 5 mg by mouth daily.    aspirin EC 81 MG tablet Take 81 mg by mouth daily.    clopidogrel (PLAVIX) 75 MG tablet Take 1 tablet (75 mg total) by mouth daily with breakfast.   finasteride (PROSCAR) 5 MG tablet Take 5 mg by mouth daily.   isosorbide mononitrate (IMDUR) 30 MG 24 hr tablet Take 1 tablet (30 mg total) by mouth 2 (two) times daily.   metoprolol succinate (TOPROL-XL) 50 MG 24 hr tablet Take 50 mg by mouth daily.   nitroGLYCERIN (NITROSTAT) 0.4 MG SL tablet DISSOLVE ONE TABLET UNDER TONGUE AS NEEDED FOR CHEST PAIN. MAY REPEAT FIVE MINUTES APART THREE TIMES IF NEEDED (Patient taking differently: as needed.)   rosuvastatin (CRESTOR) 40 MG tablet Take 40 mg by mouth daily.   sildenafil (VIAGRA) 100 MG tablet Take 100 mg by mouth as needed for erectile dysfunction.   acetaminophen (TYLENOL) 325 MG tablet Take 650 mg by mouth every 6 (six) hours as needed for mild pain. (Patient not taking: Reported on 12/26/2022)   docusate sodium (COLACE) 100 MG capsule Take 100 mg by mouth 2 (two) times daily as needed for mild constipation.    losartan (COZAAR) 50 MG tablet Take 25 mg by mouth daily.   [DISCONTINUED] buPROPion (WELLBUTRIN XL) 300 MG 24 hr tablet Take 300 mg by mouth daily.  (Patient not taking: Reported on 12/26/2022)   [DISCONTINUED] hydrochlorothiazide (MICROZIDE) 12.5 MG capsule Take 12.5 mg by mouth daily. (Patient not taking: Reported on 12/26/2022)   [DISCONTINUED] meloxicam (MOBIC) 7.5 MG tablet Take 7.5 mg by mouth 2 (two) times a day.    [DISCONTINUED] Multiple Vitamin (MULTIVITAMIN WITH MINERALS) TABS tablet Take 1 tablet by mouth daily. (Patient not taking: Reported on 12/26/2022)   [DISCONTINUED] tamsulosin (FLOMAX) 0.4 MG CAPS capsule Take 0.4 mg by mouth daily.  (Patient not taking: Reported on 12/26/2022)   [DISCONTINUED] Vitamin D, Ergocalciferol, (DRISDOL) 1.25 MG (50000 UT) CAPS capsule Take 50,000 Units by mouth once a week. (Patient not taking: Reported on 12/26/2022)   [DISCONTINUED] zolpidem (AMBIEN) 5 MG tablet Take 5 mg by mouth at bedtime as needed for sleep. (Patient not taking: Reported on 12/26/2022)   No facility-administered medications prior to visit.   No Known Allergies   There is no immunization history on file for this patient.  Health Maintenance  Topic Date Due   Medicare Annual Wellness (AWV)  Never done   COVID-19 Vaccine (1) Never done   Hepatitis C Screening  Never done   DTaP/Tdap/Td (1 - Tdap) Never done   COLONOSCOPY (Pts 45-58yrs Insurance coverage will need to be confirmed)  Never done   Lung Cancer Screening  Never done   Zoster Vaccines- Shingrix (1 of 2) Never done   Pneumonia Vaccine 16+ Years old (1 of 1 - PCV) Never done   INFLUENZA VACCINE  03/12/2023   HPV VACCINES  Aged Out    Patient Care Team: Ryan Stafford as PCP - General (Family Medicine)  Review of Systems  All other systems reviewed and are negative.      Objective    BP (!) 156/99 (BP Location: Left Arm, Patient Position: Sitting, Cuff Size: Normal)   Pulse (!) 58   Temp 98 F (36.7 C) (Oral)   Resp 13   Ht 6' (1.829 m)   Wt 191 lb 4.8 oz (86.8 kg)   SpO2 97%   BMI 25.94 kg/m    Physical Exam Vitals reviewed.  Constitutional:      General: He is not in acute distress.    Appearance: Normal appearance. He is not ill-appearing, toxic-appearing or diaphoretic.  Eyes:     Conjunctiva/sclera: Conjunctivae normal.  Cardiovascular:     Rate and Rhythm: Normal rate and regular rhythm.      Pulses: Normal pulses.     Heart sounds: Normal heart sounds. No murmur heard.    No friction rub. No gallop.  Pulmonary:     Effort: Pulmonary effort is normal. No respiratory distress.     Breath sounds: Normal breath sounds. No stridor. No wheezing, rhonchi or rales.  Abdominal:     General: Bowel sounds are normal. There is no distension.     Palpations: Abdomen is soft.     Tenderness: There is no abdominal tenderness.  Musculoskeletal:     Right lower leg: No edema.     Left lower leg: No edema.  Skin:    Findings: No erythema or rash.  Neurological:     Mental Status: He is alert and oriented to person, place, and time.     Depression Screen    12/26/2022    9:37 AM  PHQ 2/9 Scores  PHQ - 2 Score 1  PHQ- 9 Score 2   No results found for any visits on 12/26/22.  Assessment & Plan      Problem List Items Addressed This Visit       Cardiovascular and Mediastinum   Coronary artery disease    Chronic  Stable  No symptoms today  Continue to follow with cardiology as scheduled  Continue Imdur 30mg  BID and PRN Nitro 0.4mg  SL       Relevant Medications   sildenafil (VIAGRA) 100 MG tablet   Primary hypertension    Chronic  BP goal is less than 130/80,  patient's BP is not at goal  Elevated in office with multiple measurements Medications adjustments include: none, continue current medications including losartan 50mg , metoprolol 50mg  daily and amlodipine 5mg   Advised patient to avoid high salted foods in the diet  Recommend checking BP at home 1-2 hours after medications and keep record until next visit  Reports ambulatory measurements within goal range   Will collect CMP today  F/u for AWV in 6 months         Relevant Medications  sildenafil (VIAGRA) 100 MG tablet   Other Relevant Orders   Comprehensive metabolic panel   Hemoglobin A1c     Other   Hyperlipidemia - Primary    -Lipid panel collected today, will follow up once results are  available -Continue statin therapy including rosuvastatin 40mg  daily  -recommended diet low in saturated fats, rich in omega 3 foods  -continue to follow with cardiology as scheduled         Relevant Medications   sildenafil (VIAGRA) 100 MG tablet   Other Relevant Orders   Hemoglobin A1c   Lipid Profile   Screening for HIV (human immunodeficiency virus)    HIV screening ordered today        Relevant Orders   HIV Antibody (routine testing w rflx)   Smoking greater than 40 pack years    Patient is former smoker with 40 pack year history and meets criteria for lung cancer screening  We discussed this recommendation and the patient was agreeable with referral for low dose chest CT  Referral for lung CA screening submitted today       Relevant Orders   Ambulatory Referral Lung Cancer Screening [facility]   Other Visit Diagnoses     Encounter for hepatitis C screening test for low risk patient       Relevant Orders   Hepatitis C antibody        Return in about 6 months (around 06/28/2023) for AWV.       The entirety of the information documented in the History of Present Illness, Review of Systems and Physical Exam were personally obtained by me. Portions of this information were initially documented by Lubertha Basque, CMA  . I, Ryan Stafford have reviewed the documentation above for thoroughness and accuracy.      Ryan Stafford  [facility] [phone] (phone) [phone] (fax)  [facility]

## 2022-12-26 ENCOUNTER — Ambulatory Visit (INDEPENDENT_AMBULATORY_CARE_PROVIDER_SITE_OTHER): Payer: No Typology Code available for payment source | Admitting: Family Medicine

## 2022-12-26 ENCOUNTER — Encounter: Payer: Self-pay | Admitting: Family Medicine

## 2022-12-26 VITALS — BP 156/99 | HR 58 | Temp 98.0°F | Resp 13 | Ht 72.0 in | Wt 191.3 lb

## 2022-12-26 DIAGNOSIS — I10 Essential (primary) hypertension: Secondary | ICD-10-CM

## 2022-12-26 DIAGNOSIS — I2510 Atherosclerotic heart disease of native coronary artery without angina pectoris: Secondary | ICD-10-CM | POA: Insufficient documentation

## 2022-12-26 DIAGNOSIS — Z114 Encounter for screening for human immunodeficiency virus [HIV]: Secondary | ICD-10-CM | POA: Insufficient documentation

## 2022-12-26 DIAGNOSIS — Z1159 Encounter for screening for other viral diseases: Secondary | ICD-10-CM

## 2022-12-26 DIAGNOSIS — E782 Mixed hyperlipidemia: Secondary | ICD-10-CM | POA: Diagnosis not present

## 2022-12-26 DIAGNOSIS — I25118 Atherosclerotic heart disease of native coronary artery with other forms of angina pectoris: Secondary | ICD-10-CM

## 2022-12-26 DIAGNOSIS — F17210 Nicotine dependence, cigarettes, uncomplicated: Secondary | ICD-10-CM | POA: Diagnosis not present

## 2022-12-26 NOTE — Assessment & Plan Note (Signed)
Chronic  Stable  No symptoms today  Continue to follow with cardiology as scheduled  Continue Imdur 30mg  BID and PRN Nitro 0.4mg  SL

## 2022-12-26 NOTE — Patient Instructions (Signed)
It was a pleasure meeting you today!  Welcome to [facility]  I look forward to taking part in your care as your new primary care physician.    Summary of our discussion today:   A referral has been placed on your behalf for Lung Cancer screening. Our referral coordination team or the office you will be visiting will contact you within the next 2 weeks.  If you have not received a phone call within 10 business days please let us know so that we can check into this for you.  We will follow up with results of labs once they are available.     Please remember to schedule your annual physical one year from your last physical.   You should return to our clinic in 6 months for annual wellness visit.   Best Wishes,   Dr. Roxan Hockey

## 2022-12-26 NOTE — Assessment & Plan Note (Signed)
HIV screening ordered today  

## 2022-12-26 NOTE — Assessment & Plan Note (Addendum)
Chronic  BP goal is less than 130/80,  patient's BP is not at goal  Elevated in office with multiple measurements Medications adjustments include: none, continue current medications including losartan 50mg , metoprolol 50mg  daily and amlodipine 5mg   Advised patient to avoid high salted foods in the diet  Recommend checking BP at home 1-2 hours after medications and keep record until next visit  Reports ambulatory measurements within goal range   Will collect CMP today  F/u for AWV in 6 months

## 2022-12-26 NOTE — Assessment & Plan Note (Signed)
-  Lipid panel collected today, will follow up once results are available -Continue statin therapy including rosuvastatin 40mg  daily  -recommended diet low in saturated fats, rich in omega 3 foods  -continue to follow with cardiology as scheduled

## 2022-12-26 NOTE — Assessment & Plan Note (Signed)
Patient is former smoker with 40 pack year history and meets criteria for lung cancer screening  We discussed this recommendation and the patient was agreeable with referral for low dose chest CT  Referral for lung CA screening submitted today

## 2022-12-27 LAB — COMPREHENSIVE METABOLIC PANEL
ALT: 13 IU/L (ref 0–44)
AST: 16 IU/L (ref 0–40)
Albumin/Globulin Ratio: 1.5 (ref 1.2–2.2)
Albumin: 4.3 g/dL (ref 3.9–4.9)
Alkaline Phosphatase: 71 IU/L (ref 44–121)
BUN/Creatinine Ratio: 7 — ABNORMAL LOW (ref 10–24)
BUN: 9 mg/dL (ref 8–27)
Bilirubin Total: 0.7 mg/dL (ref 0.0–1.2)
CO2: 25 mmol/L (ref 20–29)
Calcium: 10.1 mg/dL (ref 8.6–10.2)
Chloride: 100 mmol/L (ref 96–106)
Creatinine, Ser: 1.31 mg/dL — ABNORMAL HIGH (ref 0.76–1.27)
Globulin, Total: 2.9 g/dL (ref 1.5–4.5)
Glucose: 86 mg/dL (ref 70–99)
Potassium: 4.5 mmol/L (ref 3.5–5.2)
Sodium: 140 mmol/L (ref 134–144)
Total Protein: 7.2 g/dL (ref 6.0–8.5)
eGFR: 60 mL/min/{1.73_m2} (ref >59)

## 2022-12-27 LAB — LIPID PANEL
Chol/HDL Ratio: 2.4 ratio (ref 0.0–5.0)
Cholesterol, Total: 139 mg/dL (ref 100–199)
HDL: 57 mg/dL (ref >39)
LDL Chol Calc (NIH): 66 mg/dL (ref 0–99)
Triglycerides: 86 mg/dL (ref 0–149)
VLDL Cholesterol Cal: 16 mg/dL (ref 5–40)

## 2022-12-27 LAB — HEPATITIS C ANTIBODY: Hep C Virus Ab: NONREACTIVE

## 2022-12-27 LAB — HIV ANTIBODY (ROUTINE TESTING W REFLEX): HIV Screen 4th Generation wRfx: NONREACTIVE

## 2022-12-27 LAB — HEMOGLOBIN A1C
Est. average glucose Bld gHb Est-mCnc: 94 mg/dL
Hgb A1c MFr Bld: 4.9 % (ref 4.8–5.6)

## 2022-12-29 ENCOUNTER — Telehealth: Payer: No Typology Code available for payment source | Admitting: Family Medicine

## 2022-12-29 DIAGNOSIS — J069 Acute upper respiratory infection, unspecified: Secondary | ICD-10-CM | POA: Diagnosis not present

## 2022-12-29 MED ORDER — AZELASTINE HCL 0.1 % NA SOLN: 2.0000 | Freq: Two times a day (BID) | NASAL | 12 refills | Status: DC

## 2022-12-29 MED ORDER — BENZONATATE 100 MG PO CAPS: 100.0000 mg | ORAL_CAPSULE | Freq: Three times a day (TID) | ORAL | 0 refills | Status: DC | PRN

## 2022-12-29 NOTE — Progress Notes (Signed)
E-Visit for Upper Respiratory Infection  ? ?We are sorry you are not feeling well.  Here is how we plan to help! ? ?Based on what you have shared with me, it looks like you may have a viral upper respiratory infection.  Upper respiratory infections are caused by a large number of viruses; however, rhinovirus is the most common cause.  ? ?Symptoms vary from person to person, with common symptoms including sore throat, cough, fatigue or lack of energy and feeling of general discomfort.  A low-grade fever of up to 100.4 may present, but is often uncommon.  Symptoms vary however, and are closely related to a person's age or underlying illnesses.  The most common symptoms associated with an upper respiratory infection are nasal discharge or congestion, cough, sneezing, headache and pressure in the ears and face.  These symptoms usually persist for about 3 to 10 days, but can last up to 2 weeks.  It is important to know that upper respiratory infections do not cause serious illness or complications in most cases.   ? ?Upper respiratory infections can be transmitted from person to person, with the most common method of transmission being a person's hands.  The virus is able to live on the skin and can infect other persons for up to 2 hours after direct contact.  Also, these can be transmitted when someone coughs or sneezes; thus, it is important to cover the mouth to reduce this risk.  To keep the spread of the illness at bay, good hand hygiene is very important. ? ?This is an infection that is most likely caused by a virus. There are no specific treatments other than to help you with the symptoms until the infection runs its course.  We are sorry you are not feeling well.  Here is how we plan to help! ? ? ?For nasal congestion, you may use an oral decongestants such as Mucinex D or if you have glaucoma or high blood pressure use plain Mucinex.  Saline nasal spray or nasal drops can help and can safely be used as often as  needed for congestion.  For your congestion, I have prescribed Azelastine nasal spray two sprays in each nostril twice a day ? ?If you do not have a history of heart disease, hypertension, diabetes or thyroid disease, prostate/bladder issues or glaucoma, you may also use Sudafed to treat nasal congestion.  It is highly recommended that you consult with a pharmacist or your primary care physician to ensure this medication is safe for you to take.    ? ?If you have a cough, you may use cough suppressants such as Delsym and Robitussin.  If you have glaucoma or high blood pressure, you can also use Coricidin HBP.   ?For cough I have prescribed for you A prescription cough medication called Tessalon Perles 100 mg. You may take 1-2 capsules every 8 hours as needed for cough ? ?If you have a sore or scratchy throat, use a saltwater gargle- ? to ? teaspoon of salt dissolved in a 4-ounce to 8-ounce glass of warm water.  Gargle the solution for approximately 15-30 seconds and then spit.  It is important not to swallow the solution.  You can also use throat lozenges/cough drops and Chloraseptic spray to help with throat pain or discomfort.  Warm or cold liquids can also be helpful in relieving throat pain. ? ?For headache, pain or general discomfort, you can use Ibuprofen or Tylenol as directed.   ?Some authorities believe   that zinc sprays or the use of Echinacea may shorten the course of your symptoms. ? ? ?HOME CARE ?Only take medications as instructed by your medical team. ?Be sure to drink plenty of fluids. Water is fine as well as fruit juices, sodas and electrolyte beverages. You may want to stay away from caffeine or alcohol. If you are nauseated, try taking small sips of liquids. How do you know if you are getting enough fluid? Your urine should be a pale yellow or almost colorless. ?Get rest. ?Taking a steamy shower or using a humidifier may help nasal congestion and ease sore throat pain. You can place a towel over  your head and breathe in the steam from hot water coming from a faucet. ?Using a saline nasal spray works much the same way. ?Cough drops, hard candies and sore throat lozenges may ease your cough. ?Avoid close contacts especially the very young and the elderly ?Cover your mouth if you cough or sneeze ?Always remember to wash your hands.  ? ?GET HELP RIGHT AWAY IF: ?You develop worsening fever. ?If your symptoms do not improve within 10 days ?You develop yellow or green discharge from your nose over 3 days. ?You have coughing fits ?You develop a severe head ache or visual changes. ?You develop shortness of breath, difficulty breathing or start having chest pain ?Your symptoms persist after you have completed your treatment plan ? ?MAKE SURE YOU  ?Understand these instructions. ?Will watch your condition. ?Will get help right away if you are not doing well or get worse. ? ?Thank you for choosing an e-visit. ? ?Your e-visit answers were reviewed by a board certified advanced clinical practitioner to complete your personal care plan. Depending upon the condition, your plan could have included both over the counter or prescription medications. ? ?Please review your pharmacy choice. Make sure the pharmacy is open so you can pick up prescription now. If there is a problem, you may contact your provider through MyChart messaging and have the prescription routed to another pharmacy.  Your safety is important to us. If you have drug allergies check your prescription carefully.  ? ?For the next 24 hours you can use MyChart to ask questions about today's visit, request a non-urgent call back, or ask for a work or school excuse. ?You will get an email in the next two days asking about your experience. I hope that your e-visit has been valuable and will speed your recovery. ? ? ? ?I provided 5 minutes of non face-to-face time during this encounter for chart review, medication and order placement, as well as and documentation.   ? ?

## 2023-01-01 ENCOUNTER — Encounter: Payer: Self-pay | Admitting: Cardiovascular Disease

## 2023-01-01 ENCOUNTER — Ambulatory Visit (INDEPENDENT_AMBULATORY_CARE_PROVIDER_SITE_OTHER): Payer: No Typology Code available for payment source | Admitting: Cardiovascular Disease

## 2023-01-01 VITALS — BP 134/84 | HR 62 | Ht 72.0 in | Wt 191.0 lb

## 2023-01-01 DIAGNOSIS — E782 Mixed hyperlipidemia: Secondary | ICD-10-CM | POA: Diagnosis not present

## 2023-01-01 DIAGNOSIS — I2119 ST elevation (STEMI) myocardial infarction involving other coronary artery of inferior wall: Secondary | ICD-10-CM | POA: Diagnosis not present

## 2023-01-01 DIAGNOSIS — I10 Essential (primary) hypertension: Secondary | ICD-10-CM

## 2023-01-01 DIAGNOSIS — I25118 Atherosclerotic heart disease of native coronary artery with other forms of angina pectoris: Secondary | ICD-10-CM

## 2023-01-01 NOTE — Progress Notes (Signed)
Cardiology Office Note   Date:  01/01/2023   ID:  Ryan Stafford, DOB 09-Mar-1956, MRN [id number]  PCP:  Ronnald Ramp, MD  Cardiologist:  Adrian Blackwater, MD      History of Present Illness: Ryan Stafford is a 67 y.o. 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   Overview: WITH LIMA TO THE DISTAL LAD, SVD TO RAMUS, SVG TO PL AND SVG TO PDA AT [facility]   S/P cardiac catheterization 12/12/2011   Overview: 3 VESSEL CAD     Past Surgical History:  Procedure Laterality Date   CARDIAC CATHETERIZATION N/A 04/20/2015   Procedure: Left Heart Cath and Cors/Grafts Angiography;  Surgeon: Marcina Millard, MD;  Location: [facility];  Service: Cardiovascular;  Laterality: N/A;   CORONARY ARTERY BYPASS GRAFT     CORONARY/GRAFT ACUTE MI REVASCULARIZATION N/A 01/03/2019   Procedure: GRAFT ANGIOGRAPHY;  Surgeon: Iran Ouch, MD;  Location: [facility];  Service: Cardiovascular;  Laterality: N/A;   LEFT HEART CATH AND CORONARY ANGIOGRAPHY N/A 01/03/2019   Procedure: LEFT HEART CATH AND CORONARY ANGIOGRAPHY;  Surgeon: Iran Ouch, MD;  Location: [facility];  Service: Cardiovascular;  Laterality: N/A;     Current Outpatient Medications  Medication Sig Dispense Refill   acetaminophen (TYLENOL) 325 MG tablet Take 650 mg by mouth every 6 (six) hours as needed for mild pain.     amLODipine (NORVASC) 5 MG tablet Take 5 mg by mouth daily.      aspirin EC 81 MG tablet Take 81 mg by mouth daily.      azelastine (ASTELIN) 0.1 % nasal spray Place 2 sprays into both nostrils 2  (two) times daily. Use in each nostril as directed 30 mL 12   benzonatate (TESSALON) 100 MG capsule Take 1 capsule (100 mg total) by mouth 3 (three) times daily as needed for cough. 30 capsule 0   clopidogrel (PLAVIX) 75 MG tablet Take 1 tablet (75 mg total) by mouth daily with breakfast. 30 tablet 0   finasteride (PROSCAR) 5 MG tablet Take 5 mg by mouth daily.     isosorbide mononitrate (IMDUR) 30 MG 24 hr tablet Take 1 tablet (30 mg total) by mouth 2 (two) times daily. 60 tablet 0   losartan (COZAAR) 50 MG tablet Take 25 mg by mouth daily.     metoprolol succinate (TOPROL-XL) 50 MG 24 hr tablet Take 50 mg by mouth daily.     nitroGLYCERIN (NITROSTAT) 0.4 MG SL tablet DISSOLVE ONE TABLET UNDER TONGUE AS NEEDED FOR CHEST PAIN. MAY REPEAT FIVE MINUTES APART THREE TIMES IF NEEDED (Patient taking differently: as needed.) 50 tablet 0   rosuvastatin (CRESTOR) 40 MG tablet Take 40 mg by mouth daily.     sildenafil (VIAGRA) 100 MG tablet Take 100 mg by mouth as needed for erectile dysfunction.     traZODone (DESYREL) 50 MG tablet Take 50 mg by mouth at bedtime.     docusate sodium (COLACE) 100 MG capsule Take  100 mg by mouth 2 (two) times daily as needed for mild constipation.  (Patient not taking: Reported on 01/01/2023)     No current facility-administered medications for this visit.    Allergies:   Patient has no known allergies.    Social History:   reports that he quit smoking about 11 years ago. His smoking use included cigarettes. He has a 40.00 pack-year smoking history. He has quit using smokeless tobacco. He reports that he does not currently use alcohol. He reports that he does not use drugs.   Family History:  family history includes Hypertension in his brother and father.    ROS:     Review of Systems  Constitutional: Negative.   HENT: Negative.    Eyes: Negative.   Respiratory: Negative.    Gastrointestinal: Negative.   Genitourinary: Negative.   Musculoskeletal: Negative.    Skin: Negative.   Neurological: Negative.   Endo/Heme/Allergies: Negative.   Psychiatric/Behavioral: Negative.    All other systems reviewed and are negative.     All other systems are reviewed and negative.    PHYSICAL EXAM: VS:  BP 134/84   Pulse 62   Ht 6' (1.829 m)   Wt 191 lb (86.6 kg)   SpO2 97%   BMI 25.90 kg/m  , BMI Body mass index is 25.9 kg/m. Last weight:  Wt Readings from Last 3 Encounters:  01/01/23 191 lb (86.6 kg)  12/26/22 191 lb 4.8 oz (86.8 kg)  01/03/19 198 lb (89.8 kg)     Physical Exam Vitals reviewed.  Constitutional:      Appearance: Normal appearance. He is normal weight.  HENT:     Head: Normocephalic.     Nose: Nose normal.     Mouth/Throat:     Mouth: Mucous membranes are moist.  Eyes:     Pupils: Pupils are equal, round, and reactive to light.  Cardiovascular:     Rate and Rhythm: Normal rate and regular rhythm.     Pulses: Normal pulses.     Heart sounds: Normal heart sounds.  Pulmonary:     Effort: Pulmonary effort is normal.  Abdominal:     General: Abdomen is flat. Bowel sounds are normal.  Musculoskeletal:        General: Normal range of motion.     Cervical back: Normal range of motion.  Skin:    General: Skin is warm.  Neurological:     General: No focal deficit present.     Mental Status: He is alert.  Psychiatric:        Mood and Affect: Mood normal.       EKG:   Recent Labs: 12/26/2022: ALT 13; BUN 9; Creatinine, Ser 1.31; Potassium 4.5; Sodium 140    Lipid Panel    Component Value Date/Time   CHOL 139 12/26/2022 1023   CHOL 166 12/13/2011 0536   TRIG 86 12/26/2022 1023   TRIG 115 12/13/2011 0536   HDL 57 12/26/2022 1023   HDL 39 (L) 12/13/2011 0536   CHOLHDL 2.4 12/26/2022 1023   VLDL 23 12/13/2011 0536   LDLCALC 66 12/26/2022 1023   LDLCALC 104 (H) 12/13/2011 0536      Other studies Reviewed: Additional studies/ records that were reviewed today include:  Review of the above records  demonstrates:       No data to display            ASSESSMENT AND PLAN:    ICD-10-CM   1. Acute ST elevation  myocardial infarction (STEMI) of inferolateral wall (HCC)  I21.19 MYOCARDIAL PERFUSION IMAGING    2. Coronary artery disease of native heart with stable angina pectoris, unspecified vessel or lesion type (HCC)  I25.118 MYOCARDIAL PERFUSION IMAGING    3. Primary hypertension  I10 MYOCARDIAL PERFUSION IMAGING    4. Mixed hyperlipidemia  E78.2 MYOCARDIAL PERFUSION IMAGING       Problem List Items Addressed This Visit       Cardiovascular and Mediastinum   Acute ST elevation myocardial infarction (STEMI) of inferolateral wall (HCC) - Primary   Relevant Orders   MYOCARDIAL PERFUSION IMAGING   Coronary artery disease    Had CABG and no stress test since 2/21, and will set it up.      Relevant Orders   MYOCARDIAL PERFUSION IMAGING   Primary hypertension    stable      Relevant Orders   MYOCARDIAL PERFUSION IMAGING     Other   Hyperlipidemia   Relevant Orders   MYOCARDIAL PERFUSION IMAGING       Disposition:   Return in about 2 weeks (around 01/15/2023) for stress test and f/u.    Total time spent: 30 minutes  Signed,  Adrian Blackwater, MD  01/01/2023 9:41 AM    [facility]

## 2023-01-01 NOTE — Assessment & Plan Note (Signed)
Had CABG and no stress test since 2/21, and will set it up.

## 2023-01-01 NOTE — Assessment & Plan Note (Signed)
stable °

## 2023-01-13 ENCOUNTER — Ambulatory Visit: Payer: No Typology Code available for payment source | Admitting: Cardiovascular Disease

## 2023-01-14 ENCOUNTER — Ambulatory Visit (INDEPENDENT_AMBULATORY_CARE_PROVIDER_SITE_OTHER): Payer: No Typology Code available for payment source

## 2023-01-14 DIAGNOSIS — I25118 Atherosclerotic heart disease of native coronary artery with other forms of angina pectoris: Secondary | ICD-10-CM

## 2023-01-14 DIAGNOSIS — I2119 ST elevation (STEMI) myocardial infarction involving other coronary artery of inferior wall: Secondary | ICD-10-CM

## 2023-01-14 DIAGNOSIS — I10 Essential (primary) hypertension: Secondary | ICD-10-CM

## 2023-01-14 DIAGNOSIS — E782 Mixed hyperlipidemia: Secondary | ICD-10-CM

## 2023-01-14 MED ORDER — TECHNETIUM TC 99M SESTAMIBI GENERIC - CARDIOLITE
30.7000 | Freq: Once | INTRAVENOUS | Status: AC | PRN
  Administered 2023-01-14: 30.7 via INTRAVENOUS

## 2023-01-14 MED ORDER — TECHNETIUM TC 99M SESTAMIBI GENERIC - CARDIOLITE
10.5000 | Freq: Once | INTRAVENOUS | Status: AC | PRN
  Administered 2023-01-14: 10.5 via INTRAVENOUS

## 2023-01-16 ENCOUNTER — Encounter: Payer: Self-pay | Admitting: Cardiovascular Disease

## 2023-01-16 ENCOUNTER — Ambulatory Visit (INDEPENDENT_AMBULATORY_CARE_PROVIDER_SITE_OTHER): Payer: No Typology Code available for payment source | Admitting: Cardiovascular Disease

## 2023-01-16 VITALS — BP 134/80 | HR 70 | Ht 72.0 in | Wt 188.0 lb

## 2023-01-16 DIAGNOSIS — E782 Mixed hyperlipidemia: Secondary | ICD-10-CM

## 2023-01-16 DIAGNOSIS — I10 Essential (primary) hypertension: Secondary | ICD-10-CM

## 2023-01-16 DIAGNOSIS — I25118 Atherosclerotic heart disease of native coronary artery with other forms of angina pectoris: Secondary | ICD-10-CM | POA: Diagnosis not present

## 2023-01-16 DIAGNOSIS — I2119 ST elevation (STEMI) myocardial infarction involving other coronary artery of inferior wall: Secondary | ICD-10-CM | POA: Diagnosis not present

## 2023-01-16 NOTE — Progress Notes (Signed)
Cardiology Office Note   Date:  01/16/2023   ID:  Ryan Stafford, DOB 09/20/1955, MRN [id number]  PCP:  Ryan Ramp, MD  Cardiologist:  Ryan Blackwater, MD      History of Present Illness: Ryan Stafford is a 67 y.o. 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   Overview: WITH LIMA TO THE DISTAL LAD, SVD TO RAMUS, SVG TO PL AND SVG TO PDA AT [facility]   S/P cardiac catheterization 12/12/2011   Overview: 3 VESSEL CAD     Past Surgical History:  Procedure Laterality Date   CARDIAC CATHETERIZATION N/A 04/20/2015   Procedure: Left Heart Cath and Cors/Grafts Angiography;  Surgeon: Ryan Millard, MD;  Location: [facility];  Service: Cardiovascular;  Laterality: N/A;   CORONARY ARTERY BYPASS GRAFT     CORONARY/GRAFT ACUTE MI REVASCULARIZATION N/A 01/03/2019   Procedure: GRAFT ANGIOGRAPHY;  Surgeon: Ryan Ouch, MD;  Location: [facility];  Service: Cardiovascular;  Laterality: N/A;   LEFT HEART CATH AND CORONARY ANGIOGRAPHY N/A 01/03/2019   Procedure: LEFT HEART CATH AND CORONARY ANGIOGRAPHY;  Surgeon: Ryan Ouch, MD;  Location: [facility];  Service: Cardiovascular;  Laterality: N/A;     Current Outpatient Medications  Medication Sig Dispense Refill   acetaminophen (TYLENOL) 325 MG tablet Take 650 mg by mouth every 6 (six) hours as needed for mild pain.     amLODipine (NORVASC) 5 MG tablet Take 5 mg by mouth daily.      aspirin EC 81 MG tablet Take 81 mg by mouth daily.      azelastine (ASTELIN) 0.1 % nasal spray Place 2 sprays into both nostrils 2 (two)  times daily. Use in each nostril as directed 30 mL 12   benzonatate (TESSALON) 100 MG capsule Take 1 capsule (100 mg total) by mouth 3 (three) times daily as needed for cough. 30 capsule 0   clopidogrel (PLAVIX) 75 MG tablet Take 1 tablet (75 mg total) by mouth daily with breakfast. 30 tablet 0   docusate sodium (COLACE) 100 MG capsule Take 100 mg by mouth 2 (two) times daily as needed for mild constipation.  (Patient not taking: Reported on 01/01/2023)     finasteride (PROSCAR) 5 MG tablet Take 5 mg by mouth daily.     isosorbide mononitrate (IMDUR) 30 MG 24 hr tablet Take 1 tablet (30 mg total) by mouth 2 (two) times daily. 60 tablet 0   losartan (COZAAR) 50 MG tablet Take 25 mg by mouth daily.     metoprolol succinate (TOPROL-XL) 50 MG 24 hr tablet Take 50 mg by mouth daily.     nitroGLYCERIN (NITROSTAT) 0.4 MG SL tablet DISSOLVE ONE TABLET UNDER TONGUE AS NEEDED FOR CHEST PAIN. MAY REPEAT FIVE MINUTES APART THREE TIMES IF NEEDED (Patient taking differently: as needed.) 50 tablet 0   rosuvastatin (CRESTOR) 40 MG tablet Take 40 mg by mouth daily.     sildenafil (VIAGRA) 100 MG tablet Take 100 mg by mouth as needed for  erectile dysfunction.     traZODone (DESYREL) 50 MG tablet Take 50 mg by mouth at bedtime.     No current facility-administered medications for this visit.    Allergies:   Patient has no known allergies.    Social History:   reports that he quit smoking about 11 years ago. His smoking use included cigarettes. He has a 40.00 pack-year smoking history. He has quit using smokeless tobacco. He reports that he does not currently use alcohol. He reports that he does not use drugs.   Family History:  family history includes Hypertension in his brother and father.    ROS:     Review of Systems  Constitutional: Negative.   HENT: Negative.    Eyes: Negative.   Respiratory: Negative.    Gastrointestinal: Negative.   Genitourinary: Negative.   Musculoskeletal: Negative.   Skin:  Negative.   Neurological: Negative.   Endo/Heme/Allergies: Negative.   Psychiatric/Behavioral: Negative.    All other systems reviewed and are negative.     All other systems are reviewed and negative.    PHYSICAL EXAM: VS:  BP 134/80   Pulse 70   Ht 6' (1.829 m)   Wt 188 lb (85.3 kg)   SpO2 97%   BMI 25.50 kg/m  , BMI Body mass index is 25.5 kg/m. Last weight:  Wt Readings from Last 3 Encounters:  01/16/23 188 lb (85.3 kg)  01/01/23 191 lb (86.6 kg)  12/26/22 191 lb 4.8 oz (86.8 kg)     Physical Exam Vitals reviewed.  Constitutional:      Appearance: Normal appearance. He is normal weight.  HENT:     Head: Normocephalic.     Nose: Nose normal.     Mouth/Throat:     Mouth: Mucous membranes are moist.  Eyes:     Pupils: Pupils are equal, round, and reactive to light.  Cardiovascular:     Rate and Rhythm: Normal rate and regular rhythm.     Pulses: Normal pulses.     Heart sounds: Normal heart sounds.  Pulmonary:     Effort: Pulmonary effort is normal.  Abdominal:     General: Abdomen is flat. Bowel sounds are normal.  Musculoskeletal:        General: Normal range of motion.     Cervical back: Normal range of motion.  Skin:    General: Skin is warm.  Neurological:     General: No focal deficit present.     Mental Status: He is alert.  Psychiatric:        Mood and Affect: Mood normal.       EKG:   Recent Labs: 12/26/2022: ALT 13; BUN 9; Creatinine, Ser 1.31; Potassium 4.5; Sodium 140    Lipid Panel    Component Value Date/Time   CHOL 139 12/26/2022 1023   CHOL 166 12/13/2011 0536   TRIG 86 12/26/2022 1023   TRIG 115 12/13/2011 0536   HDL 57 12/26/2022 1023   HDL 39 (L) 12/13/2011 0536   CHOLHDL 2.4 12/26/2022 1023   VLDL 23 12/13/2011 0536   LDLCALC 66 12/26/2022 1023   LDLCALC 104 (H) 12/13/2011 0536      Other studies Reviewed: Additional studies/ records that were reviewed today include:  Review of the above records demonstrates:        No data to display            ASSESSMENT AND PLAN:    ICD-10-CM   1. Coronary artery disease of native  heart with stable angina pectoris, unspecified vessel or lesion type (HCC)  I25.118    Stress test was normal with LVEF    2. Primary hypertension  I10     3. Mixed hyperlipidemia  E78.2     4. Acute ST elevation myocardial infarction (STEMI) of inferolateral wall (HCC)  I21.19        Problem List Items Addressed This Visit       Cardiovascular and Mediastinum   Acute ST elevation myocardial infarction (STEMI) of inferolateral wall (HCC)   Coronary artery disease - Primary   Primary hypertension     Other   Hyperlipidemia       Disposition:   No follow-ups on file.    Total time spent: 30 minutes  Signed,  Ryan Blackwater, MD  01/16/2023 10:28 AM    [facility]

## 2023-01-21 ENCOUNTER — Other Ambulatory Visit: Payer: Self-pay

## 2023-01-21 DIAGNOSIS — Z87891 Personal history of nicotine dependence: Secondary | ICD-10-CM

## 2023-01-21 DIAGNOSIS — Z122 Encounter for screening for malignant neoplasm of respiratory organs: Secondary | ICD-10-CM

## 2023-01-26 ENCOUNTER — Other Ambulatory Visit: Payer: Self-pay | Admitting: Family Medicine

## 2023-01-26 NOTE — Telephone Encounter (Signed)
Medication Refill - Medication: rosuvastatin (CRESTOR) 40 MG tablet [[id number]]   losartan (COZAAR) 50 MG tablet [[id number]]   Has the patient contacted their pharmacy? Yes.   (Agent: If no, request that the patient contact the pharmacy for the refill. If patient does not wish to contact the pharmacy document the reason why and proceed with request.) (Agent: If yes, when and what did the pharmacy advise?)  [facility] [place] [place] [facility] [address] Phone: [phone]  Fax: [phone]  acy (with phone number or street name):  Has the patient been seen for an appointment in the last year OR does the patient have an upcoming appointment? Yes.    Agent: Please be advised that RX refills may take up to 3 business days. We ask that you follow-up with your pharmacy.

## 2023-01-27 MED ORDER — LOSARTAN POTASSIUM 50 MG PO TABS: 25.0000 mg | ORAL_TABLET | Freq: Every day | ORAL | 1 refills | Status: DC

## 2023-01-27 MED ORDER — ROSUVASTATIN CALCIUM 40 MG PO TABS: 40.0000 mg | ORAL_TABLET | Freq: Every day | ORAL | 1 refills | Status: DC

## 2023-01-27 NOTE — Telephone Encounter (Signed)
Requested medication (s) are due for refill today: yes  Requested medication (s) are on the active medication list: yes  Last refill:  01/03/19 and 04/30/15  Future visit scheduled: yes  Notes to clinic:  Unable to refill per protocol, last refill by another provider.  Historical medication     Requested Prescriptions  Pending Prescriptions Disp Refills   rosuvastatin (CRESTOR) 40 MG tablet      Sig: Take 1 tablet (40 mg total) by mouth daily.     Cardiovascular:  Antilipid - Statins 2 Failed - 01/26/2023 11:23 AM      Failed - Cr in normal range and within 360 days    Creatinine  Date Value Ref Range Status  04/20/2014 1.26 0.60 - 1.30 mg/dL Final   Creatinine, Ser  Date Value Ref Range Status  12/26/2022 1.31 (H) 0.76 - 1.27 mg/dL Final         Failed - Lipid Panel in normal range within the last 12 months    Cholesterol, Total  Date Value Ref Range Status  12/26/2022 139 100 - 199 mg/dL Final   Cholesterol  Date Value Ref Range Status  12/13/2011 166 0 - 200 mg/dL Final   Ldl Cholesterol, Calc  Date Value Ref Range Status  12/13/2011 104 (H) 0 - 100 mg/dL Final   LDL Chol Calc (NIH)  Date Value Ref Range Status  12/26/2022 66 0 - 99 mg/dL Final   HDL Cholesterol  Date Value Ref Range Status  12/13/2011 39 (L) 40 - 60 mg/dL Final   HDL  Date Value Ref Range Status  12/26/2022 57 >39 mg/dL Final   Triglycerides  Date Value Ref Range Status  12/26/2022 86 0 - 149 mg/dL Final  40/98/1191 478 0 - 200 mg/dL Final         Passed - Patient is not pregnant      Passed - Valid encounter within last 12 months    Recent Outpatient Visits           1 month ago Mixed hyperlipidemia    [facility] Ronnald Ramp, MD       Future Appointments             In 2 months Laurier Nancy, MD [facility]   In 5 months Simmons-Robinson, Tawanna Cooler, MD [facility]              losartan (COZAAR) 50 MG tablet      Sig: Take 0.5 tablets (25 mg total) by mouth daily.     Cardiovascular:  Angiotensin Receptor Blockers Failed - 01/26/2023 11:23 AM      Failed - Cr in normal range and within 180 days    Creatinine  Date Value Ref Range Status  04/20/2014 1.26 0.60 - 1.30 mg/dL Final   Creatinine, Ser  Date Value Ref Range Status  12/26/2022 1.31 (H) 0.76 - 1.27 mg/dL Final         Passed - K in normal range and within 180 days    Potassium  Date Value Ref Range Status  12/26/2022 4.5 3.5 - 5.2 mmol/L Final  04/20/2014 4.6 3.5 - 5.1 mmol/L Final         Passed - Patient is not pregnant      Passed - Last BP in normal range    BP Readings from Last 1 Encounters:  01/16/23 134/80         Passed - Valid encounter  within last 6 months    Recent Outpatient Visits           1 month ago Mixed hyperlipidemia   [place] [facility] [place], Tawanna Cooler, MD       Future Appointments             In 2 months Laurier Nancy, MD [facility]   In 5 months Simmons-Robinson, Tawanna Cooler, MD [facility], [place]

## 2023-02-10 ENCOUNTER — Other Ambulatory Visit: Payer: Self-pay | Admitting: Family Medicine

## 2023-02-10 NOTE — Telephone Encounter (Signed)
[facility] faxed refill request for the following medications:  sildenafil (VIAGRA) 100 MG tablet    Please advise.

## 2023-02-11 ENCOUNTER — Encounter: Payer: Self-pay | Admitting: Physician Assistant

## 2023-02-11 ENCOUNTER — Ambulatory Visit: Payer: No Typology Code available for payment source | Admitting: Physician Assistant

## 2023-02-11 DIAGNOSIS — Z91199 Patient's noncompliance with other medical treatment and regimen due to unspecified reason: Secondary | ICD-10-CM

## 2023-02-11 MED ORDER — SILDENAFIL CITRATE 100 MG PO TABS: 100.0000 mg | ORAL_TABLET | ORAL | 0 refills | Status: DC | PRN

## 2023-02-11 NOTE — Progress Notes (Signed)
    Pt did not answer for shared decision making virtual visit.  Darcella Gasman Adaijah Endres, PA-C

## 2023-02-17 ENCOUNTER — Ambulatory Visit: Payer: No Typology Code available for payment source

## 2023-03-18 ENCOUNTER — Other Ambulatory Visit: Payer: Self-pay | Admitting: Cardiovascular Disease

## 2023-04-16 ENCOUNTER — Ambulatory Visit: Payer: No Typology Code available for payment source | Admitting: Cardiovascular Disease

## 2023-05-04 ENCOUNTER — Other Ambulatory Visit: Payer: Self-pay

## 2023-05-04 ENCOUNTER — Telehealth: Payer: Self-pay | Admitting: Family Medicine

## 2023-05-04 MED ORDER — SILDENAFIL CITRATE 100 MG PO TABS: 100.0000 mg | ORAL_TABLET | ORAL | 0 refills | Status: DC | PRN

## 2023-05-04 NOTE — Telephone Encounter (Signed)
[facility] is requesting prescription refill sildenafil (VIAGRA) 100 MG tablet  Please advise

## 2023-05-08 ENCOUNTER — Ambulatory Visit: Payer: No Typology Code available for payment source | Admitting: Cardiovascular Disease

## 2023-05-28 ENCOUNTER — Other Ambulatory Visit: Payer: Self-pay | Admitting: Cardiovascular Disease

## 2023-05-28 ENCOUNTER — Other Ambulatory Visit: Payer: Self-pay | Admitting: Physician Assistant

## 2023-05-28 NOTE — Telephone Encounter (Signed)
Requested medication (s) are due for refill today: Yes  Requested medication (s) are on the active medication list: No  Last refill:  12/03/22  Future visit scheduled: Yes  Notes to clinic:  Medication not on active medication list      Requested Prescriptions  Pending Prescriptions Disp Refills   buPROPion (WELLBUTRIN XL) 300 MG 24 hr tablet [Pharmacy Med Name: BUPROPION HYDROCHLORIDE ER (XL) 300MG  XL TABLET ER 24HR] 90 tablet 1    Sig: TAKE ONE TABLET BY MOUTH ONCE A DAY     Psychiatry: Antidepressants - bupropion Failed - 05/28/2023  3:28 PM      Failed - Cr in normal range and within 360 days    Creatinine  Date Value Ref Range Status  04/20/2014 1.26 0.60 - 1.30 mg/dL Final   Creatinine, Ser  Date Value Ref Range Status  12/26/2022 1.31 (H) 0.76 - 1.27 mg/dL Final         Passed - AST in normal range and within 360 days    AST  Date Value Ref Range Status  12/26/2022 16 0 - 40 IU/L Final   SGOT(AST)  Date Value Ref Range Status  12/12/2011 31 15 - 37 Unit/L Final         Passed - ALT in normal range and within 360 days    ALT  Date Value Ref Range Status  12/26/2022 13 0 - 44 IU/L Final   SGPT (ALT)  Date Value Ref Range Status  12/12/2011 20 U/L Final    Comment:    12-78 NOTE: NEW REFERENCE RANGE 07/04/2011          Passed - Last BP in normal range    BP Readings from Last 1 Encounters:  01/16/23 134/80         Passed - Valid encounter within last 6 months    Recent Outpatient Visits           5 months ago Mixed hyperlipidemia   [place] [facility] Simmons-Robinson, Tawanna Cooler, MD       Future Appointments             In 1 month Simmons-Robinson, Tawanna Cooler, MD [facility]

## 2023-05-29 ENCOUNTER — Telehealth: Payer: Self-pay

## 2023-05-29 DIAGNOSIS — Z008 Encounter for other general examination: Secondary | ICD-10-CM | POA: Diagnosis not present

## 2023-05-29 DIAGNOSIS — E663 Overweight: Secondary | ICD-10-CM | POA: Diagnosis not present

## 2023-05-29 DIAGNOSIS — F17290 Nicotine dependence, other tobacco product, uncomplicated: Secondary | ICD-10-CM | POA: Diagnosis not present

## 2023-05-29 DIAGNOSIS — Z6825 Body mass index (BMI) 25.0-25.9, adult: Secondary | ICD-10-CM | POA: Diagnosis not present

## 2023-05-29 DIAGNOSIS — F17211 Nicotine dependence, cigarettes, in remission: Secondary | ICD-10-CM | POA: Diagnosis not present

## 2023-05-29 MED ORDER — BUPROPION HCL ER (XL) 300 MG PO TB24: 300.0000 mg | ORAL_TABLET | Freq: Every day | ORAL | 3 refills | Status: DC

## 2023-05-29 NOTE — Telephone Encounter (Signed)
Detailed VM left per DPR. CRM created. Ok for [facility] to advise/clarify if patient returns call

## 2023-05-29 NOTE — Telephone Encounter (Signed)
Rx sent to pharmacy for wellbutrin 300mg  daily

## 2023-05-29 NOTE — Telephone Encounter (Signed)
Copied from CRM [id number]. Topic: General - Other >> May 29, 2023 11:31 AM Everette C wrote: Reason for CRM: The patient has called to follow up on their previous refill request of buPROPion (WELLBUTRIN XL) 300 MG 24 hr tablet from 05/28/23 The patient has stressed the urgency of their request for contact regarding the prescription  Please contact further when possible

## 2023-06-13 ENCOUNTER — Other Ambulatory Visit: Payer: Self-pay | Admitting: Cardiovascular Disease

## 2023-06-29 ENCOUNTER — Ambulatory Visit (INDEPENDENT_AMBULATORY_CARE_PROVIDER_SITE_OTHER): Payer: No Typology Code available for payment source | Admitting: Family Medicine

## 2023-06-29 ENCOUNTER — Encounter: Payer: Self-pay | Admitting: Family Medicine

## 2023-06-29 VITALS — BP 120/89 | HR 90 | Temp 98.2°F | Resp 18 | Ht 72.0 in | Wt 194.0 lb

## 2023-06-29 DIAGNOSIS — Z1211 Encounter for screening for malignant neoplasm of colon: Secondary | ICD-10-CM

## 2023-06-29 DIAGNOSIS — E782 Mixed hyperlipidemia: Secondary | ICD-10-CM

## 2023-06-29 DIAGNOSIS — I10 Essential (primary) hypertension: Secondary | ICD-10-CM | POA: Diagnosis not present

## 2023-06-29 DIAGNOSIS — I25118 Atherosclerotic heart disease of native coronary artery with other forms of angina pectoris: Secondary | ICD-10-CM | POA: Diagnosis not present

## 2023-06-29 DIAGNOSIS — Z0000 Encounter for general adult medical examination without abnormal findings: Secondary | ICD-10-CM | POA: Diagnosis not present

## 2023-06-29 DIAGNOSIS — Z131 Encounter for screening for diabetes mellitus: Secondary | ICD-10-CM

## 2023-06-29 MED ORDER — METOPROLOL SUCCINATE ER 50 MG PO TB24: 50.0000 mg | ORAL_TABLET | Freq: Every day | ORAL | 1 refills | Status: DC

## 2023-06-29 MED ORDER — TRAZODONE HCL 50 MG PO TABS: 50.0000 mg | ORAL_TABLET | Freq: Every day | ORAL | 2 refills | Status: DC

## 2023-06-29 MED ORDER — SILDENAFIL CITRATE 100 MG PO TABS
50.0000 mg | ORAL_TABLET | Freq: Every day | ORAL | 3 refills | Status: DC | PRN
Start: 2023-06-29 — End: 2023-11-12

## 2023-06-29 MED ORDER — BUPROPION HCL ER (XL) 300 MG PO TB24: 300.0000 mg | ORAL_TABLET | Freq: Every day | ORAL | 1 refills | Status: DC

## 2023-06-29 NOTE — Assessment & Plan Note (Addendum)
Annual wellness visit completed today including all of the following: -Reviewed patient's family medical history -Reviewed and updated patient's list of medical providers -Completed assessment of cognitive impairment -Completed assessment of patient's functional ability -Provided patient with recommendations for health screening services as well as vaccines Health risk assessment completed and reviewed -Discussed recommendations for well-balanced diet in addition to 150 minutes of physical activity per week  He also expressed intent to receive a COVID vaccine for the 2024-25 season. He is considering the shingles vaccine and is due for a tetanus vaccine. He UTD for pneumonia vaccine and is considering getting a flu vaccine once he fully recovers from his recent cold.

## 2023-06-29 NOTE — Patient Instructions (Signed)
It was a pleasure to see you today!  Thank you for choosing [facility] for your primary care.   Today you were seen for your Medicare Annual Wellness Exam   Please review the attached information regarding helpful preventive health topics.   To keep you healthy, please keep in mind the following health maintenance items that you are due for:   1.Updated Tetanus vaccine  2. Shingrix vaccine  3. Completed and send in your cologuard for colon cancer screening  4. Flu vaccine once you have recovered from your recent viral illness (1-2 weeks)  Please make sure to schedule your next annual physical for one year from today.   Best Wishes,   Dr. Roxan Hockey

## 2023-06-29 NOTE — Assessment & Plan Note (Signed)
Annual Physical  Chronic conditions stable Reports good overall health, regular physical activity, and no new symptoms. Discussed importance of vaccinations and screenings. Prefers to wait 1-2 weeks for flu vaccine due to recent cold. Informed about Shingrix's 90% efficacy and potential flu-like symptoms. 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 Encouraged Cologuard screening for colon cancer. Recommended COVID vaccine for the season. - Perform complete metabolic panel - Check hemoglobin A1c - Check cholesterol panel - Perform CBC - Up-to-date on pneumonia vaccine - Recommend flu vaccine in 1-2 weeks after recovery from illness  - Recommend shingles vaccine (Shingrix) - Recommend tetanus vaccine - Encourage completion of Cologuard screening for colon cancer - Recommend COVID vaccine for the season - Schedule follow-up in 6 months

## 2023-06-29 NOTE — Progress Notes (Signed)
Subjective:   Ryan Stafford is a 67 y.o. male who presents for Medicare Annual/Subsequent preventive examination.  Visit Complete: In person  Patient Medicare AWV questionnaire was completed by the patient on 06/29/23 ; I have confirmed that all information answered by patient is correct and no changes since this date.    Discussed the use of AI scribe software for clinical note transcription with the patient, who gave verbal consent to proceed.  History of Present Illness   The patient, 67 year old male, presented for his annual wellness visit and physical. He reported adhering to a general diet with portion control and engaging in regular physical activity, primarily walking and doing work around his [place]. He reported feeling generally well and sleeping adequately.  The patient recently experienced a severe head cold, which he was recovering from at the time of the visit. He reported no other acute health issues. The patient has a history of open heart surgery.  The patient reported taking metoprolol daily and was due for a refill. He also mentioned taking sildenafil and trazodone, the latter not regularly but as needed for sleep. He expressed a desire to keep nitroglycerin on hand, although he hopes to never need it.  The patient is due for a colon cancer screening and had a Cologuard test at home ready to be sent in once he is able to provide the stool sample.          Objective:    Today's Vitals   06/29/23 0925  BP: 120/89  Pulse: 90  Resp: 18  Temp: 98.2 F (36.8 C)  SpO2: 98%  Weight: 194 lb (88 kg)  Height: 6' (1.829 m)   Body mass index is 26.31 kg/m.  Physical Exam Vitals reviewed.  Constitutional:      General: He is not in acute distress.    Appearance: Normal appearance. He is not ill-appearing, toxic-appearing or diaphoretic.  HENT:     Head: Normocephalic and atraumatic.     Right Ear: Tympanic membrane and external ear normal.     Left Ear:  Tympanic membrane and external ear normal.     Nose: Nose normal.     Mouth/Throat:     Mouth: Mucous membranes are moist.     Pharynx: No oropharyngeal exudate or posterior oropharyngeal erythema.  Eyes:     General: No scleral icterus.    Extraocular Movements: Extraocular movements intact.     Conjunctiva/sclera: Conjunctivae normal.     Pupils: Pupils are equal, round, and reactive to light.  Neck:     Vascular: No carotid bruit.  Cardiovascular:     Rate and Rhythm: Normal rate and regular rhythm.     Pulses: Normal pulses.     Heart sounds: Normal heart sounds. No murmur heard.    No friction rub. No gallop.  Pulmonary:     Effort: Pulmonary effort is normal. No respiratory distress.     Breath sounds: Normal breath sounds. No stridor. No wheezing, rhonchi or rales.  Abdominal:     General: A surgical scar is present. Bowel sounds are normal. There is no distension.     Palpations: Abdomen is soft.     Tenderness: There is no abdominal tenderness.  Musculoskeletal:        General: No swelling, tenderness or signs of injury. Normal range of motion.     Cervical back: Normal range of motion and neck supple. No rigidity or tenderness.     Right  lower leg: No edema.     Left lower leg: No edema.  Lymphadenopathy:     Cervical: No cervical adenopathy.  Skin:    General: Skin is warm and dry.     Capillary Refill: Capillary refill takes less than 2 seconds.     Findings: No erythema or rash.  Neurological:     General: No focal deficit present.     Mental Status: He is alert and oriented to person, place, and time.     Cranial Nerves: Cranial nerves 2-12 are intact.     Sensory: Sensation is intact.     Motor: Motor function is intact. No weakness, tremor or abnormal muscle tone.     Gait: Gait normal.  Psychiatric:        Attention and Perception: Attention normal.        Mood and Affect: Mood normal.        Speech: Speech normal.        Behavior: Behavior normal.  Behavior is cooperative.        Thought Content: Thought content normal.           01/03/2019    3:00 PM 01/03/2019   11:52 AM 04/20/2015    7:00 AM  Advanced Directives  Does Patient Have a Medical Advance Directive? No No No  Would patient like information on creating a medical advance directive? No - Patient declined  No - patient declined information    Current Medications (verified) Outpatient Encounter Medications as of 06/29/2023  Medication Sig   acetaminophen (TYLENOL) 325 MG tablet Take 650 mg by mouth every 6 (six) hours as needed for mild pain.   amLODipine (NORVASC) 5 MG tablet Take 5 mg by mouth daily.    aspirin EC 81 MG tablet Take 81 mg by mouth daily.    azelastine (ASTELIN) 0.1 % nasal spray Place 2 sprays into both nostrils 2 (two) times daily. Use in each nostril as directed   benzonatate (TESSALON) 100 MG capsule Take 1 capsule (100 mg total) by mouth 3 (three) times daily as needed for cough.   clopidogrel (PLAVIX) 75 MG tablet TAKE ONE TABLET BY MOUTH ONCE A DAY   docusate sodium (COLACE) 100 MG capsule Take 100 mg by mouth 2 (two) times daily as needed for mild constipation.   finasteride (PROSCAR) 5 MG tablet Take 5 mg by mouth daily.   isosorbide mononitrate (IMDUR) 30 MG 24 hr tablet TAKE ONE TABLET BY MOUTH ONCE A DAY   losartan (COZAAR) 50 MG tablet Take 0.5 tablets (25 mg total) by mouth daily.   nitroGLYCERIN (NITROSTAT) 0.4 MG SL tablet DISSOLVE ONE TABLET UNDER TONGUE AS NEEDED FOR CHEST PAIN. MAY REPEAT FIVE MINUTES APART THREE TIMES IF NEEDED (Patient taking differently: as needed.)   rosuvastatin (CRESTOR) 40 MG tablet Take 1 tablet (40 mg total) by mouth daily.   sildenafil (VIAGRA) 100 MG tablet Take 0.5-1 tablets (50-100 mg total) by mouth daily as needed for erectile dysfunction.   tamsulosin (FLOMAX) 0.4 MG CAPS capsule Take 0.4 mg by mouth daily.   [DISCONTINUED] buPROPion (WELLBUTRIN XL) 300 MG 24 hr tablet Take 1 tablet (300 mg total) by  mouth daily.   [DISCONTINUED] metoprolol succinate (TOPROL-XL) 50 MG 24 hr tablet Take 50 mg by mouth daily.   [DISCONTINUED] traZODone (DESYREL) 50 MG tablet Take 50 mg by mouth at bedtime.   buPROPion (WELLBUTRIN XL) 300 MG 24 hr tablet Take 1 tablet (300 mg total) by mouth daily.  metoprolol succinate (TOPROL-XL) 50 MG 24 hr tablet Take 1 tablet (50 mg total) by mouth daily.   traZODone (DESYREL) 50 MG tablet Take 1 tablet (50 mg total) by mouth at bedtime.   No facility-administered encounter medications on file as of 06/29/2023.    Allergies (verified) Patient has no known allergies.   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   Overview: WITH LIMA TO THE DISTAL LAD, SVD TO RAMUS, SVG TO PL AND SVG TO PDA AT [facility]   S/P cardiac catheterization 12/12/2011   Overview: 3 VESSEL CAD   Past Surgical History:  Procedure Laterality Date   CARDIAC CATHETERIZATION N/A 04/20/2015   Procedure: Left Heart Cath and Cors/Grafts Angiography;  Surgeon: Marcina Millard, MD;  Location: [facility] INVASIVE CV LAB;  Service: Cardiovascular;  Laterality: N/A;   CORONARY ARTERY BYPASS GRAFT     CORONARY/GRAFT ACUTE MI REVASCULARIZATION N/A 01/03/2019   Procedure: GRAFT ANGIOGRAPHY;  Surgeon: Iran Ouch, MD;  Location: [facility] INVASIVE CV LAB;  Service: Cardiovascular;  Laterality: N/A;   LEFT HEART CATH AND CORONARY ANGIOGRAPHY N/A 01/03/2019   Procedure: LEFT HEART CATH AND CORONARY ANGIOGRAPHY;  Surgeon: Iran Ouch, MD;  Location: [facility] INVASIVE CV LAB;  Service: Cardiovascular;  Laterality: N/A;   Family History  Problem Relation Age of Onset   Hypertension Father    Hypertension Brother    Social History    Socioeconomic History   Marital status: Married    Spouse name: Indra Okelley   Number of children: 1   Years of education: Not on file   Highest education level: Not on file  Occupational History   Occupation: retired    Comment: retired from [employer] in [employer], 2022  Tobacco Use   Smoking status: Former    Current packs/day: 0.00    Average packs/day: 1.5 packs/day for 43.0 years (64.5 ttl pk-yrs)    Types: Cigarettes    Start date: 04/19/1968    Quit date: 04/20/2011    Years since quitting: 12.2   Smokeless tobacco: Former  Substance and Sexual Activity   Alcohol use: Not Currently   Drug use: Never   Sexual activity: Yes  Other Topics Concern   Not on file  Social History Narrative   Not on file   Social Determinants of Health   Financial Resource Strain: Medium Risk (06/29/2023)   Overall Financial Resource Strain (CARDIA)    Difficulty of Paying Living Expenses: Somewhat hard  Food Insecurity: No Food Insecurity (06/29/2023)   Hunger Vital Sign    Worried About Running Out of Food in the Last Year: Never true    Ran Out of Food in the Last Year: Never true  Transportation Needs: No Transportation Needs (06/29/2023)   PRAPARE - Administrator, Civil Service (Medical): No    Lack of Transportation (Non-Medical): No  Physical Activity: Sufficiently Active (06/29/2023)   Exercise Vital Sign    Days of Exercise per Week: 3 days    Minutes of Exercise per Session: 60 min  Stress: No Stress Concern Present (06/29/2023)   [employer] of Occupational Health - Occupational Stress Questionnaire    Feeling of Stress :  Only a little  Social Connections: Moderately Integrated (06/29/2023)   Social Connection and Isolation Panel [NHANES]    Frequency of Communication with Friends and Family: More than three times a week    Frequency of Social Gatherings with Friends and Family: Three times a week    Attends Religious Services: More than 4 times  per year    Active Member of Clubs or Organizations: No    Attends Banker Meetings: Never    Marital Status: Married    Tobacco Counseling Counseling given: Not Answered   Clinical Intake:  Pre-visit preparation completed: Yes  Pain : No/denies pain     BMI - recorded: 26.31 Nutritional Status: BMI 25 -29 Overweight Diabetes: No  How often do you need to have someone help you when you read instructions, pamphlets, or other written materials from your doctor or pharmacy?: 1 - Never  Interpreter Needed?: No      Activities of Daily Living    06/29/2023    9:19 AM 12/26/2022    9:37 AM  In your present state of health, do you have any difficulty performing the following activities:  Hearing? 0 0  Vision? 0 1  Difficulty concentrating or making decisions? 1 1  Walking or climbing stairs? 0 0  Dressing or bathing? 0 0  Doing errands, shopping? 0 0  Preparing Food and eating ? N   Using the Toilet? N   In the past six months, have you accidently leaked urine? N   Do you have problems with loss of bowel control? N   Managing your Medications? N   Managing your Finances? N   Housekeeping or managing your Housekeeping? N     Patient Care Team: Ronnald Ramp, MD as PCP - General (Family Medicine)  Indicate any recent Medical Services you may have received from other than [facility] providers in the past year (date may be approximate).     Assessment:   This is a routine wellness examination for Jandel.  Hearing/Vision screen No results found.   Goals Addressed   None    Depression Screen    06/29/2023    9:28 AM 12/26/2022    9:37 AM  PHQ 2/9 Scores  PHQ - 2 Score 1 1  PHQ- 9 Score 3 2    Fall Risk    06/29/2023    9:29 AM 12/26/2022    9:37 AM  Fall Risk   Falls in the past year? 0 0  Number falls in past yr: 0 0  Injury with Fall? 0 0  Risk for fall due to :  No Fall Risks  Follow up  Falls evaluation completed     MEDICARE RISK AT HOME:    TIMED UP AND GO:  Was the test performed?  No    Cognitive Function:        Immunizations  There is no immunization history on file for this patient.  TDAP status: Due, Education has been provided regarding the importance of this vaccine. Advised may receive this vaccine at local pharmacy.  Aware to provide a copy of the vaccination record if obtained from local pharmacy or Health Dept. Verbalized acceptance and understanding.  Flu Vaccine status: Due, Education has been provided regarding the importance of this vaccine. Advised may receive this vaccine at local pharmacy or Health Dept. Aware to provide a copy of the vaccination record if obtained from local pharmacy or Health Dept. Verbalized acceptance and understanding.  Pneumococcal vaccine status: Up  to date  Covid-19 vaccine status: Information provided on how to obtain vaccines.   Qualifies for Shingles Vaccine? Yes   Zostavax completed Yes   Shingrix Completed?: No.    Education has been provided regarding the importance of this vaccine. Patient has been advised to call insurance company to determine out of pocket expense if they have not yet received this vaccine. Advised may also receive vaccine at local pharmacy or Health Dept. Verbalized acceptance and understanding.  Screening Tests Health Maintenance  Topic Date Due   DTaP/Tdap/Td (1 - Tdap) Never done   Fecal DNA (Cologuard)  Never done   Lung Cancer Screening  Never done   Zoster Vaccines- Shingrix (1 of 2) Never done   Pneumonia Vaccine 32+ Years old (1 of 1 - PCV) Never done   INFLUENZA VACCINE  Never done   COVID-19 Vaccine (1 - 2023-24 season) Never done   Medicare Annual Wellness (AWV)  06/28/2024   Hepatitis C Screening  Completed   HPV VACCINES  Aged Out    Health Maintenance  Health Maintenance Due  Topic Date Due   DTaP/Tdap/Td (1 - Tdap) Never done   Fecal DNA (Cologuard)  Never done   Lung Cancer Screening   Never done   Zoster Vaccines- Shingrix (1 of 2) Never done   Pneumonia Vaccine 19+ Years old (1 of 1 - PCV) Never done   INFLUENZA VACCINE  Never done   COVID-19 Vaccine (1 - 2023-24 season) Never done    Colorectal cancer screening: Type of screening: Cologuard. Completed not yet, has to provide sample and return kit. Repeat every 3 years  Lung Cancer Screening: (Low Dose CT Chest recommended if Age 67-80 years, 20 pack-year currently smoking OR have quit w/in 15years.) does not qualify.   Lung Cancer Screening Referral: not submitted  Tobacco Use: Medium Risk (06/29/2023)   Patient History    Smoking Tobacco Use: Former    Smokeless Tobacco Use: Former    Passive Exposure: Not on file     Additional Screening:  Hepatitis C Screening: does qualify; Completed 12/26/22 (non-reactive)  Dental Screening: Recommended annual dental exams for proper oral hygiene  Diabetic Foot Exam: N/A, no diagnosis of DM   Community Resource Referral / Chronic Care Management: CRR required this visit?  No   CCM required this visit?  No     Plan:     I have personally reviewed and noted the following in the patient's chart:   Medical and social history Use of alcohol, tobacco or illicit drugs  Current medications and supplements including opioid prescriptions. Patient is not currently taking opioid prescriptions. Functional ability and status Nutritional status Physical activity Advanced directives List of other physicians Hospitalizations, surgeries, and ER visits in previous 12 months Vitals Screenings to include cognitive, depression, and falls Referrals and appointments  In addition, I have reviewed and discussed with patient certain preventive protocols, quality metrics, and best practice recommendations. A written personalized care plan for preventive services as well as general preventive health recommendations were provided to patient.   Encounter for annual wellness visit  (AWV) in Medicare patient Annual wellness visit completed today including all of the following: -Reviewed patient's family medical history -Reviewed and updated patient's list of medical providers -Completed assessment of cognitive impairment -Completed assessment of patient's functional ability -Provided patient with recommendations for health screening services as well as vaccines Health risk assessment completed and reviewed -Discussed recommendations for well-balanced diet in addition to 150 minutes of physical activity  per week  He also expressed intent to receive a COVID vaccine for the 2024-25 season. He is considering the shingles vaccine and is due for a tetanus vaccine. He UTD for pneumonia vaccine and is considering getting a flu vaccine once he fully recovers from his recent cold.    Annual physical exam Annual Physical  Chronic conditions stable Reports good overall health, regular physical activity, and no new symptoms. Discussed importance of vaccinations and screenings. Prefers to wait 1-2 weeks for flu vaccine due to recent cold. Informed about Shingrix's 90% efficacy and potential flu-like symptoms. 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 Encouraged Cologuard screening for colon cancer. Recommended COVID vaccine for the season. - Perform complete metabolic panel - Check hemoglobin A1c - Check cholesterol panel - Perform CBC - Up-to-date on pneumonia vaccine - Recommend flu vaccine in 1-2 weeks after recovery from illness  - Recommend shingles vaccine (Shingrix) - Recommend tetanus vaccine - Encourage completion of Cologuard screening for colon cancer - Recommend COVID vaccine for the season - Schedule follow-up in 6 months    Hypertension Well-controlled with current medication regimen. Blood pressure within normal limits. Requested refills for metoprolol and losartan. - Refill metoprolol 1 tablet daily - Refill losartan  Hyperlipidemia Managed  with current medication regimen. No new concerns. Confirmed Crestor is up to date. - Ensure Crestor is up to date  Depression Managed with bupropion. No new concerns. Confirmed bupropion is up to date. - Refill bupropion 300 mg once daily  Erectile Dysfunction Managed with sildenafil. Requested change in quantity for cost efficiency. Discussed benefits of increasing quantity from 10 to 30 tablets. - Refill sildenafil 100 mg, quantity 30, to be filled at [facility]  Insomnia Intermittent use of trazodone for sleep. No new concerns. Requested refill for trazodone. - Refill trazodone for sleep as needed  Follow-up - Schedule follow-up in 6 months - Monitor lab results and Cologuard results.        Ronnald Ramp, MD   06/29/2023   After Visit Summary: (In Person-Printed) AVS printed and given to the patient

## 2023-06-30 LAB — LIPID PANEL
Chol/HDL Ratio: 4 ratio (ref 0.0–5.0)
Cholesterol, Total: 179 mg/dL (ref 100–199)
HDL: 45 mg/dL (ref >39)
LDL Chol Calc (NIH): 114 mg/dL — ABNORMAL HIGH (ref 0–99)
Triglycerides: 113 mg/dL (ref 0–149)
VLDL Cholesterol Cal: 20 mg/dL (ref 5–40)

## 2023-06-30 LAB — CMP14+EGFR
ALT: 17 [IU]/L (ref 0–44)
AST: 17 [IU]/L (ref 0–40)
Albumin: 4 g/dL (ref 3.9–4.9)
Alkaline Phosphatase: 68 [IU]/L (ref 44–121)
BUN/Creatinine Ratio: 9 — ABNORMAL LOW (ref 10–24)
BUN: 11 mg/dL (ref 8–27)
Bilirubin Total: 0.7 mg/dL (ref 0.0–1.2)
CO2: 25 mmol/L (ref 20–29)
Calcium: 9.6 mg/dL (ref 8.6–10.2)
Chloride: 103 mmol/L (ref 96–106)
Creatinine, Ser: 1.24 mg/dL (ref 0.76–1.27)
Globulin, Total: 2.9 g/dL (ref 1.5–4.5)
Glucose: 96 mg/dL (ref 70–99)
Potassium: 4 mmol/L (ref 3.5–5.2)
Sodium: 141 mmol/L (ref 134–144)
Total Protein: 6.9 g/dL (ref 6.0–8.5)
eGFR: 64 mL/min/{1.73_m2} (ref >59)

## 2023-06-30 LAB — CBC
Hematocrit: 40.1 % (ref 37.5–51.0)
Hemoglobin: 13.1 g/dL (ref 13.0–17.7)
MCH: 32 pg (ref 26.6–33.0)
MCHC: 32.7 g/dL (ref 31.5–35.7)
MCV: 98 fL — ABNORMAL HIGH (ref 79–97)
Platelets: 481 10*3/uL — ABNORMAL HIGH (ref 150–450)
RBC: 4.1 x10E6/uL — ABNORMAL LOW (ref 4.14–5.80)
RDW: 11.8 % (ref 11.6–15.4)
WBC: 3.5 10*3/uL (ref 3.4–10.8)

## 2023-06-30 LAB — HEMOGLOBIN A1C
Est. average glucose Bld gHb Est-mCnc: 97 mg/dL
Hgb A1c MFr Bld: 5 % (ref 4.8–5.6)

## 2023-07-06 ENCOUNTER — Telehealth: Payer: Self-pay | Admitting: *Deleted

## 2023-07-06 NOTE — Telephone Encounter (Signed)
Pt given lab results per notes of Dr. Roxan Hockey from 07/03/23 on 07/06/23. Pt verbalized understanding and is requesting to recheck labs prior to starting Repatha injections. Patient reports his eating habits may have been "off track".  Please advise if labs can be done or how long to repeat lab work. Patient would like call back.

## 2023-07-07 NOTE — Telephone Encounter (Signed)
I spoke to patient. He is pleased with Dr. Danella Penton request and has scheduled a follow up in Feb 2025 to have repeat labs and discuss cholesterol/diet further.

## 2023-07-07 NOTE — Telephone Encounter (Signed)
Please schedule follow up in Feb 2025 to repeat labs and follow up on dietary changes for lipids

## 2023-07-22 ENCOUNTER — Other Ambulatory Visit: Payer: Self-pay | Admitting: Family Medicine

## 2023-08-10 ENCOUNTER — Other Ambulatory Visit: Payer: Self-pay | Admitting: Family Medicine

## 2023-09-10 ENCOUNTER — Other Ambulatory Visit: Payer: Self-pay | Admitting: Cardiovascular Disease

## 2023-09-17 ENCOUNTER — Other Ambulatory Visit: Payer: Self-pay | Admitting: Family Medicine

## 2023-09-17 ENCOUNTER — Telehealth: Payer: Self-pay | Admitting: Family Medicine

## 2023-09-17 NOTE — Telephone Encounter (Signed)
 Medication Refill -  Most Recent Primary Care Visit:  Provider: SIMMONS-ROBINSON, MAKIERA  Department: [id number]  Visit Type: PHYSICAL  Date: 06/29/2023  Medication: tamsulosin  (FLOMAX ) 0.4 MG CAPS capsule [[id number]]   Has the patient contacted their pharmacy? Yes  (Agent: If yes, when and what did the pharmacy advise?) contact office   Is this the correct pharmacy for this prescription? Yes  This is the patient's preferred pharmacy:  [facility] - [place], [place] - [address] [place] [address] Phone: [phone] Fax: [phone]   Has the prescription been filled recently? Yes  Is the patient out of the medication? No  Has the patient been seen for an appointment in the last year OR does the patient have an upcoming appointment? Yes  Can we respond through MyChart? Yes  Agent: Please be advised that Rx refills may take up to 3 business days. We ask that you follow-up with your pharmacy.

## 2023-09-17 NOTE — Telephone Encounter (Signed)
 Pharmacy requested today in a separate refill encounter, this is a duplicate request.

## 2023-09-23 ENCOUNTER — Encounter: Payer: Self-pay | Admitting: Family Medicine

## 2023-09-23 ENCOUNTER — Ambulatory Visit (INDEPENDENT_AMBULATORY_CARE_PROVIDER_SITE_OTHER): Payer: No Typology Code available for payment source | Admitting: Family Medicine

## 2023-09-23 VITALS — BP 140/78 | HR 55 | Ht 72.0 in | Wt 200.9 lb

## 2023-09-23 DIAGNOSIS — I10 Essential (primary) hypertension: Secondary | ICD-10-CM

## 2023-09-23 DIAGNOSIS — I25118 Atherosclerotic heart disease of native coronary artery with other forms of angina pectoris: Secondary | ICD-10-CM

## 2023-09-23 DIAGNOSIS — Z1211 Encounter for screening for malignant neoplasm of colon: Secondary | ICD-10-CM

## 2023-09-23 DIAGNOSIS — F17210 Nicotine dependence, cigarettes, uncomplicated: Secondary | ICD-10-CM

## 2023-09-23 DIAGNOSIS — E782 Mixed hyperlipidemia: Secondary | ICD-10-CM

## 2023-09-23 NOTE — Assessment & Plan Note (Signed)
Chronic condition with LDL last recorded at 114 mg/dL, goal is <04 mg/dL. Patient on Crestor 40 mg daily. Plan to recheck lipid panel. Discussed importance of maintaining LDL <70 mg/dL to reduce cardiovascular risk. Patient understands need for dietary management and medication adherence. - Order lipid panel

## 2023-09-23 NOTE — Assessment & Plan Note (Signed)
Chronic condition with current blood pressure of 154/87 mmHg, not at goal. Patient reports dietary indiscretion during Super Bowl weekend. Current medications: amlodipine 5 mg, losartan 25 mg, metoprolol 50 mg daily. Discussed importance of maintaining target blood pressure (120-130/70-80 mmHg) to prevent complications. Patient agrees to home monitoring and follow-up in one month. Consider increasing losartan to 50 mg if blood pressure remains elevated. - Recheck blood pressure with manual cuff - Monitor blood pressure at home daily, aiming for 120-130/70-80 mmHg - Follow-up in one month to reassess blood pressure - Consider increasing losartan to 50 mg if blood pressure remains elevated

## 2023-09-23 NOTE — Assessment & Plan Note (Signed)
History of STEMI. Current medications: aspirin 81 mg, Plavix 75 mg, Imdur 30 mg, metoprolol 50 mg daily. Requires ongoing management of hyperlipidemia and hypertension. Discussed importance of medication adherence and lifestyle modifications to prevent further cardiac events. Chronic, stable  - Continue current medications: aspirin 81 mg, Plavix 75 mg, Imdur 30 mg, metoprolol 50 mg daily

## 2023-09-23 NOTE — Progress Notes (Signed)
Established patient visit   Patient: Ryan Stafford   DOB: 04-09-1956   68 y.o. Male  MRN: [id number]  Visit Date: 09/23/2023  Today's healthcare provider: Ronnald Ramp, MD   Chief Complaint  Patient presents with   Medical Management of Chronic Issues    1 month follow-up   Hyperlipidemia    Discuss repatha   Subjective     HPI     Medical Management of Chronic Issues    Additional comments: 1 month follow-up        Hyperlipidemia    Additional comments: Discuss repatha      Last edited by Acey Lav, CMA on 09/23/2023  9:12 AM.       Discussed the use of AI scribe software for clinical note transcription with the patient, who gave verbal consent to proceed.  History of Present Illness   His blood pressure is elevated today at 154/87 mmHg, which he attributes to dietary choices made during Super Bowl weekend, specifically consuming a large amount of pork. He confirms taking his blood pressure medications this morning before the appointment. He is currently on amlodipine 5 mg, losartan 25 mg, and metoprolol 50 mg daily for hypertension.  For coronary artery disease, he is on aspirin 81 mg, Plavix 75 mg, and Imdur 30 mg. He takes Crestor 40 mg daily for cholesterol management. He has a history of smoking, which is relevant to his current health maintenance needs.  [place] is retired and [place], engaging in various chores and activities such as fishing and mowing the yard. He generally maintains a healthy diet, avoiding pork except for occasional bacon, and describes himself as 'real simple people.'  He feels 'sluggish' at times but generally feels well on a day-to-day basis. No recent colds or flu-like symptoms.      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   amLODipine (NORVASC) 5 MG tablet Take 5 mg by mouth daily.    aspirin EC 81 MG tablet Take 81 mg by mouth daily.    azelastine (ASTELIN) 0.1 % nasal spray Place 2 sprays into both nostrils 2 (two) times daily. Use in each nostril as directed   benzonatate (TESSALON) 100 MG capsule Take 1 capsule (100 mg total) by mouth 3 (three) times daily as needed for cough.   buPROPion (WELLBUTRIN XL) 300 MG 24 hr tablet Take 1 tablet (300 mg total) by mouth daily.   clopidogrel (PLAVIX) 75 MG tablet TAKE ONE TABLET BY MOUTH ONCE A DAY   docusate sodium (COLACE) 100 MG capsule Take 100 mg by mouth 2 (two) times daily as needed for mild constipation.   finasteride (PROSCAR) 5 MG tablet Take 5 mg by mouth daily.   isosorbide mononitrate (IMDUR) 30 MG 24 hr tablet TAKE  ONE TABLET BY MOUTH ONCE A DAY   losartan (COZAAR) 25 MG tablet TAKE ONE TABLET BY MOUTH ONCE A DAY   metoprolol succinate (TOPROL-XL) 50 MG 24 hr tablet Take 1 tablet (50 mg total) by mouth daily.   nitroGLYCERIN (NITROSTAT) 0.4 MG SL tablet DISSOLVE ONE TABLET UNDER TONGUE AS NEEDED FOR CHEST PAIN. MAY REPEAT FIVE MINUTES APART THREE TIMES IF NEEDED (Patient taking differently: as needed.)   rosuvastatin (CRESTOR) 40 MG tablet TAKE ONE TABLET (40 MG TOTAL) BY MOUTH DAILY.   sildenafil (VIAGRA) 100 MG tablet Take 0.5-1 tablets (50-100 mg total) by mouth daily as needed for erectile dysfunction.   tamsulosin (FLOMAX) 0.4 MG CAPS capsule TAKE ONE CAPSULE BY MOUTH EVERY DAY 30 MINS FOLLOWING THE SAME MEAL EACH DAY   traZODone  (DESYREL) 50 MG tablet Take 1 tablet (50 mg total) by mouth at bedtime.   No facility-administered medications prior to visit.    Review of Systems  Last metabolic panel Lab Results  Component Value Date   GLUCOSE 96 06/29/2023   NA 141 06/29/2023   K 4.0 06/29/2023   CL 103 06/29/2023   CO2 25 06/29/2023   BUN 11 06/29/2023   CREATININE 1.24 06/29/2023   EGFR 64 06/29/2023   CALCIUM 9.6 06/29/2023   PROT 6.9 06/29/2023   ALBUMIN 4.0 06/29/2023   LABGLOB 2.9 06/29/2023   AGRATIO 1.5 12/26/2022   BILITOT 0.7 06/29/2023   ALKPHOS 68 06/29/2023   AST 17 06/29/2023   ALT 17 06/29/2023   ANIONGAP 8 01/03/2019   Last lipids Lab Results  Component Value Date   CHOL 179 06/29/2023   HDL 45 06/29/2023   LDLCALC 114 (H) 06/29/2023   TRIG 113 06/29/2023   CHOLHDL 4.0 06/29/2023        Objective    BP (!) 140/78 (Cuff Size: Normal)   Pulse (!) 55   Ht 6' (1.829 m)   Wt 200 lb 14.4 oz (91.1 kg)   SpO2 98%   BMI 27.25 kg/m  BP Readings from Last 3 Encounters:  09/23/23 (!) 140/78  06/29/23 120/89  01/16/23 134/80   Wt Readings from Last 3 Encounters:  09/23/23 200 lb 14.4 oz (91.1 kg)  06/29/23 194 lb (88 kg)  01/16/23 188 lb (85.3 kg)        Physical Exam Vitals reviewed.  Constitutional:      General: He is not in acute distress.    Appearance: Normal appearance. He is not ill-appearing, toxic-appearing or diaphoretic.  Eyes:     Conjunctiva/sclera: Conjunctivae normal.  Cardiovascular:     Rate and Rhythm: Normal rate and regular rhythm.     Pulses: Normal pulses.     Heart sounds: Normal heart sounds. No murmur heard.    No friction rub. No gallop.  Pulmonary:     Effort: Pulmonary effort is normal. No respiratory distress.     Breath sounds: Normal breath sounds. No stridor. No wheezing, rhonchi or rales.  Abdominal:     General: Bowel sounds are normal. There is no distension.     Palpations: Abdomen is soft.     Tenderness: There is no  abdominal tenderness.  Musculoskeletal:     Right lower leg: No edema.     Left lower leg: No edema.  Skin:    Findings: No erythema or rash.  Neurological:     Mental Status: He is alert and oriented to person, place, and time.  Psychiatric:  Mood and Affect: Mood and affect normal.        Speech: Speech normal.        Behavior: Behavior normal. Behavior is cooperative.       No results found for any visits on 09/23/23.  Assessment & Plan     Problem List Items Addressed This Visit       Cardiovascular and Mediastinum   Primary hypertension - Primary   Chronic condition with current blood pressure of 154/87 mmHg, not at goal. Patient reports dietary indiscretion during Super Bowl weekend. Current medications: amlodipine 5 mg, losartan 25 mg, metoprolol 50 mg daily. Discussed importance of maintaining target blood pressure (120-130/70-80 mmHg) to prevent complications. Patient agrees to home monitoring and follow-up in one month. Consider increasing losartan to 50 mg if blood pressure remains elevated. - Recheck blood pressure with manual cuff - Monitor blood pressure at home daily, aiming for 120-130/70-80 mmHg - Follow-up in one month to reassess blood pressure - Consider increasing losartan to 50 mg if blood pressure remains elevated      Coronary artery disease   History of STEMI. Current medications: aspirin 81 mg, Plavix 75 mg, Imdur 30 mg, metoprolol 50 mg daily. Requires ongoing management of hyperlipidemia and hypertension. Discussed importance of medication adherence and lifestyle modifications to prevent further cardiac events. Chronic, stable  - Continue current medications: aspirin 81 mg, Plavix 75 mg, Imdur 30 mg, metoprolol 50 mg daily        Other   Smoking greater than 40 pack years   Relevant Orders   Ambulatory Referral Lung Cancer Screening [facility]   Hyperlipidemia   Chronic condition with LDL last recorded at 114 mg/dL, goal is <96  mg/dL. Patient on Crestor 40 mg daily. Plan to recheck lipid panel. Discussed importance of maintaining LDL <70 mg/dL to reduce cardiovascular risk. Patient understands need for dietary management and medication adherence. - Order lipid panel      Relevant Orders   Lipid panel   Other Visit Diagnoses       Screening for colon cancer       Relevant Orders   Cologuard            General Health Maintenance Requires lung cancer screening due to smoking history. Needs colon cancer screening, updated tetanus, pneumococcal, and shingles vaccines. Declined flu vaccine due to past adverse experiences. Discussed benefits of screenings and vaccinations. Patient agrees to proceed with recommended screenings and vaccinations except for flu vaccine. - Ordered lung cancer screening - Ordered Cologuard for colon cancer screening - Recommend tetanus vaccine - Recommend pneumococcal vaccine - Recommend shingles vaccine - Declined flu vaccine, still recommended          Return in about 1 month (around 10/21/2023) for HTN.         Ronnald Ramp, MD  [facility] [phone] (phone) [phone] (fax)  [facility]

## 2023-09-23 NOTE — Patient Instructions (Addendum)
Recommended Vaccines  -Updated Tdap booster  -Shingrix vacicne   A referral has been placed on your behalf for pulmonology and cologuard. Our referral coordination team or the office you will be visiting will contact you within the next 2 weeks.  If you have not received a phone call within 10 business days please let us know so that we can check into this for you.    Please check your blood pressure 1-2 hours after your blood pressure medications every day for the next month.   Your goal is to have blood pressures under 140/90.   Please continue your blood pressure medications as prescribed

## 2023-09-24 ENCOUNTER — Encounter: Payer: Self-pay | Admitting: Family Medicine

## 2023-09-24 LAB — LIPID PANEL
Chol/HDL Ratio: 2.7 {ratio} (ref 0.0–5.0)
Cholesterol, Total: 153 mg/dL (ref 100–199)
HDL: 56 mg/dL (ref >39)
LDL Chol Calc (NIH): 79 mg/dL (ref 0–99)
Triglycerides: 95 mg/dL (ref 0–149)
VLDL Cholesterol Cal: 18 mg/dL (ref 5–40)

## 2023-09-25 ENCOUNTER — Encounter: Payer: Self-pay | Admitting: Cardiovascular Disease

## 2023-09-25 ENCOUNTER — Ambulatory Visit (INDEPENDENT_AMBULATORY_CARE_PROVIDER_SITE_OTHER): Payer: No Typology Code available for payment source | Admitting: Cardiovascular Disease

## 2023-09-25 VITALS — BP 132/78 | HR 65 | Ht 72.0 in | Wt 201.0 lb

## 2023-09-25 DIAGNOSIS — I25118 Atherosclerotic heart disease of native coronary artery with other forms of angina pectoris: Secondary | ICD-10-CM

## 2023-09-25 DIAGNOSIS — I340 Nonrheumatic mitral (valve) insufficiency: Secondary | ICD-10-CM | POA: Diagnosis not present

## 2023-09-25 DIAGNOSIS — I2119 ST elevation (STEMI) myocardial infarction involving other coronary artery of inferior wall: Secondary | ICD-10-CM

## 2023-09-25 DIAGNOSIS — I10 Essential (primary) hypertension: Secondary | ICD-10-CM | POA: Diagnosis not present

## 2023-09-25 DIAGNOSIS — E782 Mixed hyperlipidemia: Secondary | ICD-10-CM | POA: Diagnosis not present

## 2023-09-25 NOTE — Progress Notes (Signed)
Cardiology Office Note   Date:  09/25/2023   ID:  Ryan Stafford, DOB 11-03-1955, MRN [id number]  PCP:  Ronnald Ramp, MD  Cardiologist:  Adrian Blackwater, MD      History of Present Illness: Ryan Stafford is a 68 y.o. 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   Overview: WITH LIMA TO THE DISTAL LAD, SVD TO RAMUS, SVG TO PL AND SVG TO PDA AT [facility]   S/P cardiac catheterization 12/12/2011   Overview: 3 VESSEL CAD     Past Surgical History:  Procedure Laterality Date   CARDIAC CATHETERIZATION N/A 04/20/2015   Procedure: Left Heart Cath and Cors/Grafts Angiography;  Surgeon: Marcina Millard, MD;  Location: [facility] INVASIVE CV LAB;  Service: Cardiovascular;  Laterality: N/A;   CORONARY ARTERY BYPASS GRAFT     CORONARY/GRAFT ACUTE MI REVASCULARIZATION N/A 01/03/2019   Procedure: GRAFT ANGIOGRAPHY;  Surgeon: Iran Ouch, MD;  Location: [facility] INVASIVE CV LAB;  Service: Cardiovascular;  Laterality: N/A;   LEFT HEART CATH AND CORONARY ANGIOGRAPHY N/A 01/03/2019   Procedure: LEFT HEART CATH AND CORONARY ANGIOGRAPHY;  Surgeon: Iran Ouch, MD;  Location: [facility] INVASIVE CV LAB;  Service: Cardiovascular;  Laterality: N/A;     Current Outpatient Medications  Medication Sig Dispense Refill   acetaminophen (TYLENOL) 325 MG tablet Take 650 mg by mouth every 6 (six) hours as needed for mild pain.     amLODipine (NORVASC) 5 MG tablet Take 5 mg by mouth daily.      aspirin EC 81 MG tablet Take 81 mg by mouth daily.      azelastine (ASTELIN) 0.1 % nasal spray Place 2 sprays into both nostrils 2  (two) times daily. Use in each nostril as directed 30 mL 12   benzonatate (TESSALON) 100 MG capsule Take 1 capsule (100 mg total) by mouth 3 (three) times daily as needed for cough. 30 capsule 0   buPROPion (WELLBUTRIN XL) 300 MG 24 hr tablet Take 1 tablet (300 mg total) by mouth daily. 90 tablet 1   clopidogrel (PLAVIX) 75 MG tablet TAKE ONE TABLET BY MOUTH ONCE A DAY 90 tablet 0   docusate sodium (COLACE) 100 MG capsule Take 100 mg by mouth 2 (two) times daily as needed for mild constipation.     finasteride (PROSCAR) 5 MG tablet Take 5 mg by mouth daily.     isosorbide mononitrate (IMDUR) 30 MG 24 hr tablet TAKE ONE TABLET BY MOUTH ONCE A DAY 30 tablet 5   losartan (COZAAR) 25 MG tablet TAKE ONE TABLET BY MOUTH ONCE A DAY 30 tablet 3   metoprolol succinate (TOPROL-XL) 50 MG 24 hr tablet Take 1 tablet (50 mg total) by mouth daily. 90 tablet 1   nitroGLYCERIN (NITROSTAT) 0.4 MG SL tablet DISSOLVE ONE TABLET UNDER TONGUE AS NEEDED FOR CHEST PAIN. MAY REPEAT FIVE MINUTES APART THREE TIMES IF NEEDED (Patient taking differently: as needed.) 50 tablet 0   rosuvastatin (CRESTOR) 40 MG tablet TAKE ONE TABLET (40 MG  TOTAL) BY MOUTH DAILY. 90 tablet 3   sildenafil (VIAGRA) 100 MG tablet Take 0.5-1 tablets (50-100 mg total) by mouth daily as needed for erectile dysfunction. 30 tablet 3   tamsulosin (FLOMAX) 0.4 MG CAPS capsule TAKE ONE CAPSULE BY MOUTH EVERY DAY 30 MINS FOLLOWING THE SAME MEAL EACH DAY 30 capsule 3   traZODone (DESYREL) 50 MG tablet Take 1 tablet (50 mg total) by mouth at bedtime. 30 tablet 2   No current facility-administered medications for this visit.    Allergies:   Patient has no known allergies.    Social History:   reports that he quit smoking about 12 years ago. His smoking use included cigarettes. He started smoking about 55 years ago. He has a 64.5 pack-year smoking history. He has quit using smokeless tobacco. He reports that he does not currently use alcohol. He reports that  he does not use drugs.   Family History:  family history includes Hypertension in his brother and father.    ROS:     Review of Systems  Constitutional: Negative.   HENT: Negative.    Eyes: Negative.   Respiratory: Negative.    Gastrointestinal: Negative.   Genitourinary: Negative.   Musculoskeletal: Negative.   Skin: Negative.   Neurological: Negative.   Endo/Heme/Allergies: Negative.   Psychiatric/Behavioral: Negative.    All other systems reviewed and are negative.     All other systems are reviewed and negative.    PHYSICAL EXAM: VS:  BP 132/78   Pulse 65   Ht 6' (1.829 m)   Wt 201 lb (91.2 kg)   SpO2 98%   BMI 27.26 kg/m  , BMI Body mass index is 27.26 kg/m. Last weight:  Wt Readings from Last 3 Encounters:  09/25/23 201 lb (91.2 kg)  09/23/23 200 lb 14.4 oz (91.1 kg)  06/29/23 194 lb (88 kg)     Physical Exam Vitals reviewed.  Constitutional:      Appearance: Normal appearance. He is normal weight.  HENT:     Head: Normocephalic.     Nose: Nose normal.     Mouth/Throat:     Mouth: Mucous membranes are moist.  Eyes:     Pupils: Pupils are equal, round, and reactive to light.  Cardiovascular:     Rate and Rhythm: Normal rate and regular rhythm.     Pulses: Normal pulses.     Heart sounds: Normal heart sounds.  Pulmonary:     Effort: Pulmonary effort is normal.  Abdominal:     General: Abdomen is flat. Bowel sounds are normal.  Musculoskeletal:        General: Normal range of motion.     Cervical back: Normal range of motion.  Skin:    General: Skin is warm.  Neurological:     General: No focal deficit present.     Mental Status: He is alert.  Psychiatric:        Mood and Affect: Mood normal.       EKG:   Recent Labs: 06/29/2023: ALT 17; BUN 11; Creatinine, Ser 1.24; Hemoglobin 13.1; Platelets 481; Potassium 4.0; Sodium 141    Lipid Panel    Component Value Date/Time   CHOL 153 09/23/2023 0954   CHOL 166 12/13/2011 0536   TRIG  95 09/23/2023 0954   TRIG 115 12/13/2011 0536   HDL 56 09/23/2023 0954   HDL 39 (L) 12/13/2011 0536   CHOLHDL 2.7 09/23/2023 0954   VLDL 23 12/13/2011 0536   LDLCALC 79  09/23/2023 0954   LDLCALC 104 (H) 12/13/2011 0536      Other studies Reviewed: Additional studies/ records that were reviewed today include:  Review of the above records demonstrates:       No data to display            ASSESSMENT AND PLAN:    ICD-10-CM   1. Coronary artery disease of native heart with stable angina pectoris, unspecified vessel or lesion type (HCC)  I25.118 PCV ECHOCARDIOGRAM COMPLETE    2. Primary hypertension  I10 PCV ECHOCARDIOGRAM COMPLETE    3. Mixed hyperlipidemia  E78.2 PCV ECHOCARDIOGRAM COMPLETE    4. Acute ST elevation myocardial infarction (STEMI) of inferolateral wall (HCC)  I21.19 PCV ECHOCARDIOGRAM COMPLETE   stress test 6/24 normal    5. Nonrheumatic mitral valve regurgitation  I34.0 PCV ECHOCARDIOGRAM COMPLETE   mild MR, normal LVEF, on echo 2020, advise f/u echo       Problem List Items Addressed This Visit       Cardiovascular and Mediastinum   Acute ST elevation myocardial infarction (STEMI) of inferolateral wall (HCC)   Relevant Orders   PCV ECHOCARDIOGRAM COMPLETE   Coronary artery disease - Primary   Relevant Orders   PCV ECHOCARDIOGRAM COMPLETE   Primary hypertension   Relevant Orders   PCV ECHOCARDIOGRAM COMPLETE     Other   Hyperlipidemia   Relevant Orders   PCV ECHOCARDIOGRAM COMPLETE   Other Visit Diagnoses       Nonrheumatic mitral valve regurgitation       mild MR, normal LVEF, on echo 2020, advise f/u echo   Relevant Orders   PCV ECHOCARDIOGRAM COMPLETE          Disposition:   Return in about 2 months (around 11/23/2023) for echo and f/u.    Total time spent: 30 minutes  Signed,  Adrian Blackwater, MD  09/25/2023 12:04 PM    [facility]

## 2023-10-03 ENCOUNTER — Other Ambulatory Visit: Payer: Self-pay | Admitting: Family Medicine

## 2023-10-07 ENCOUNTER — Ambulatory Visit: Payer: No Typology Code available for payment source

## 2023-10-07 DIAGNOSIS — I2119 ST elevation (STEMI) myocardial infarction involving other coronary artery of inferior wall: Secondary | ICD-10-CM

## 2023-10-07 DIAGNOSIS — I10 Essential (primary) hypertension: Secondary | ICD-10-CM

## 2023-10-07 DIAGNOSIS — I361 Nonrheumatic tricuspid (valve) insufficiency: Secondary | ICD-10-CM

## 2023-10-07 DIAGNOSIS — I340 Nonrheumatic mitral (valve) insufficiency: Secondary | ICD-10-CM

## 2023-10-07 DIAGNOSIS — I371 Nonrheumatic pulmonary valve insufficiency: Secondary | ICD-10-CM | POA: Diagnosis not present

## 2023-10-07 DIAGNOSIS — I25118 Atherosclerotic heart disease of native coronary artery with other forms of angina pectoris: Secondary | ICD-10-CM

## 2023-10-07 DIAGNOSIS — E782 Mixed hyperlipidemia: Secondary | ICD-10-CM

## 2023-10-09 LAB — COLOGUARD

## 2023-10-21 ENCOUNTER — Ambulatory Visit: Payer: No Typology Code available for payment source | Admitting: Family Medicine

## 2023-10-21 ENCOUNTER — Other Ambulatory Visit: Payer: Self-pay | Admitting: Family Medicine

## 2023-10-21 NOTE — Telephone Encounter (Signed)
 LOV 2*12*25 NOV [id number] LRF unknown  LABS 16*10*96

## 2023-10-21 NOTE — Progress Notes (Deleted)
 Established patient visit   Patient: Ryan Stafford   DOB: Mar 02, 1956   68 y.o. Male  MRN: [id number] Visit Date: 10/21/2023  Today's healthcare provider: Ronnald Ramp, MD   No chief complaint on file.  Subjective       Discussed the use of AI scribe software for clinical note transcription with the patient, who gave verbal consent to proceed.  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   amLODipine (NORVASC) 5 MG tablet Take 5 mg by mouth daily.    aspirin EC 81 MG tablet Take 81 mg by mouth daily.    azelastine (ASTELIN) 0.1 % nasal spray Place 2 sprays into both nostrils 2 (two) times daily. Use in each nostril as directed   benzonatate (TESSALON) 100 MG capsule Take 1 capsule (100 mg total) by mouth 3 (three) times daily as needed for cough.   buPROPion (WELLBUTRIN XL) 300 MG 24 hr tablet Take 1 tablet (300 mg total) by mouth daily.   clopidogrel (PLAVIX) 75 MG tablet TAKE ONE TABLET BY MOUTH ONCE A DAY   docusate sodium (COLACE) 100 MG capsule Take 100 mg by mouth 2 (two) times daily as needed for mild constipation.   finasteride (PROSCAR) 5 MG tablet Take 5 mg by mouth daily.   isosorbide mononitrate (IMDUR) 30 MG 24 hr tablet TAKE ONE TABLET BY MOUTH ONCE A DAY   losartan (COZAAR) 25 MG tablet TAKE ONE TABLET BY MOUTH ONCE A  DAY   metoprolol succinate (TOPROL-XL) 50 MG 24 hr tablet Take 1 tablet (50 mg total) by mouth daily.   nitroGLYCERIN (NITROSTAT) 0.4 MG SL tablet DISSOLVE ONE TABLET UNDER TONGUE AS NEEDED FOR CHEST PAIN. MAY REPEAT FIVE MINUTES APART THREE TIMES IF NEEDED (Patient taking differently: as needed.)   rosuvastatin (CRESTOR) 40 MG tablet TAKE ONE TABLET (40 MG TOTAL) BY MOUTH DAILY.   sildenafil (VIAGRA) 100 MG tablet Take 0.5-1 tablets (50-100 mg total) by mouth daily as needed for erectile dysfunction.   tamsulosin (FLOMAX) 0.4 MG CAPS capsule TAKE ONE CAPSULE BY MOUTH EVERY DAY 30 MINS FOLLOWING THE SAME MEAL EACH DAY   traZODone (DESYREL) 50 MG tablet TAKE ONE TABLET (50 MG TOTAL) BY MOUTH AT BEDTIME.   No facility-administered medications prior to visit.    Review of Systems  Last metabolic panel Lab Results  Component Value Date   GLUCOSE 96 06/29/2023   NA 141 06/29/2023   K 4.0 06/29/2023   CL 103 06/29/2023   CO2 25 06/29/2023   BUN 11 06/29/2023   CREATININE 1.24 06/29/2023   EGFR 64 06/29/2023   CALCIUM 9.6 06/29/2023   PROT 6.9 06/29/2023   ALBUMIN 4.0 06/29/2023  LABGLOB 2.9 06/29/2023   AGRATIO 1.5 12/26/2022   BILITOT 0.7 06/29/2023   ALKPHOS 68 06/29/2023   AST 17 06/29/2023   ALT 17 06/29/2023   ANIONGAP 8 01/03/2019   Last lipids Lab Results  Component Value Date   CHOL 153 09/23/2023   HDL 56 09/23/2023   LDLCALC 79 09/23/2023   TRIG 95 09/23/2023   CHOLHDL 2.7 09/23/2023   Last hemoglobin A1c Lab Results  Component Value Date   HGBA1C 5.0 06/29/2023   Last thyroid functions No results found for: "TSH", "T3TOTAL", "T4TOTAL", "THYROIDAB"   {See past labs  Heme  Chem  Endocrine  Serology  Results Review (optional):1}   Objective    There were no vitals taken for this visit. BP Readings from Last 3 Encounters:  09/25/23 132/78  09/23/23 (!) 140/78  06/29/23 120/89   Wt Readings from Last 3 Encounters:  09/25/23 201 lb (91.2 kg)   09/23/23 200 lb 14.4 oz (91.1 kg)  06/29/23 194 lb (88 kg)    {See vitals history (optional):1}    Physical Exam  ***  No results found for any visits on 10/21/23.  Assessment & Plan     Problem List Items Addressed This Visit   None                 No follow-ups on file.         Ronnald Ramp, MD  [facility] [phone] (phone) [phone] (fax)  [facility]

## 2023-10-22 DIAGNOSIS — Z1211 Encounter for screening for malignant neoplasm of colon: Secondary | ICD-10-CM | POA: Diagnosis not present

## 2023-10-28 ENCOUNTER — Ambulatory Visit: Admitting: Family Medicine

## 2023-10-28 LAB — COLOGUARD: COLOGUARD: NEGATIVE

## 2023-11-11 ENCOUNTER — Other Ambulatory Visit: Payer: Self-pay | Admitting: Family Medicine

## 2023-11-11 ENCOUNTER — Other Ambulatory Visit: Payer: Self-pay | Admitting: Cardiovascular Disease

## 2023-11-23 ENCOUNTER — Ambulatory Visit: Payer: No Typology Code available for payment source | Admitting: Cardiovascular Disease

## 2023-11-30 ENCOUNTER — Encounter: Payer: Self-pay | Admitting: Family Medicine

## 2023-11-30 ENCOUNTER — Ambulatory Visit: Admitting: Family Medicine

## 2023-11-30 ENCOUNTER — Ambulatory Visit (INDEPENDENT_AMBULATORY_CARE_PROVIDER_SITE_OTHER): Admitting: Family Medicine

## 2023-11-30 VITALS — BP 125/80 | HR 65 | Ht 72.0 in | Wt 201.0 lb

## 2023-11-30 DIAGNOSIS — E782 Mixed hyperlipidemia: Secondary | ICD-10-CM

## 2023-11-30 DIAGNOSIS — I10 Essential (primary) hypertension: Secondary | ICD-10-CM | POA: Diagnosis not present

## 2023-11-30 NOTE — Progress Notes (Unsigned)
 Established patient visit   Patient: Ryan Stafford   DOB: August 06, 1956   68 y.o. Male  MRN: [id number] Visit Date: 11/30/2023  Today's healthcare provider: Mimi Alt, MD   Chief Complaint  Patient presents with   Hypertension    No concerns    Subjective     HPI     Hypertension    Additional comments: No concerns       Last edited by Bart Lieu, CMA on 11/30/2023  2:32 PM.       Discussed the use of AI scribe software for clinical note transcription with the patient, who gave verbal consent to proceed.  History of Present Illness Ryan Stafford is a 68 year old male with hypertension who presents for follow-up.  His blood pressure is well controlled at 125/80 mmHg. He is currently taking Endur 30 mg daily, losartan  25 mg daily, metoprolol  50 mg daily, and amlodipine  5 mg daily for hypertension. He feels well on these medications and reports feeling 'pretty good'.  He is also on crystal 40 mg daily for hyperlipidemia. He mentions that dietary modifications have contributed to his good blood pressure readings.  He shares that his mother-in-law, who is 22 years old, is currently hospitalized due to back pain. She has a history of arthritis and had a fall six weeks ago resulting in six broken ribs and a punctured lung, which have since healed. He and his wife have been caring for her, and she is described as a 'tough lady'.  He recounts his past experience with heart attacks and open-heart surgery, noting that he was hospitalized for about a week before the procedure and was encouraged to walk soon after surgery, which he did. He recalls being very active in the hospital, which he believes aided his recovery.     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   amLODipine  (NORVASC ) 5 MG tablet TAKE ONE TABLET BY MOUTH ONCE A DAY   aspirin  EC 81 MG tablet Take 81 mg by mouth daily.    benzonatate  (TESSALON ) 100 MG capsule Take 1 capsule (100 mg total) by mouth 3 (three) times daily as needed for cough.   buPROPion  (WELLBUTRIN  XL) 300 MG 24 hr tablet Take 1 tablet (300 mg total) by mouth daily.   clopidogrel  (PLAVIX ) 75 MG tablet TAKE ONE TABLET BY MOUTH ONCE A DAY   docusate sodium  (COLACE) 100 MG capsule Take 100 mg by mouth 2 (two) times daily as needed for mild constipation.   finasteride  (PROSCAR ) 5 MG tablet Take 5 mg by mouth daily.   isosorbide  mononitrate (IMDUR ) 30 MG 24 hr tablet TAKE ONE TABLET BY MOUTH ONCE A DAY   losartan  (COZAAR ) 25 MG tablet TAKE ONE TABLET BY MOUTH ONCE A DAY   metoprolol  succinate (  TOPROL -XL) 50 MG 24 hr tablet Take 1 tablet (50 mg total) by mouth daily.   nitroGLYCERIN  (NITROSTAT ) 0.4 MG SL tablet DISSOLVE ONE TABLET UNDER TONGUE AS NEEDED FOR CHEST PAIN. MAY REPEAT FIVE MINUTES APART THREE TIMES IF NEEDED (Patient taking differently: as needed.)   rosuvastatin  (CRESTOR ) 40 MG tablet TAKE ONE TABLET (40 MG TOTAL) BY MOUTH DAILY.   tamsulosin  (FLOMAX ) 0.4 MG CAPS capsule TAKE ONE CAPSULE BY MOUTH EVERY DAY 30 MINS FOLLOWING THE SAME MEAL EACH DAY   traZODone  (DESYREL ) 50 MG tablet TAKE ONE TABLET (50 MG TOTAL) BY MOUTH AT BEDTIME.   azelastine  (ASTELIN ) 0.1 % nasal spray Place 2 sprays into both nostrils 2 (two) times daily. Use in each nostril as directed (Patient not taking: Reported on 11/30/2023)   No  facility-administered medications prior to visit.    Review of Systems  Last metabolic panel Lab Results  Component Value Date   GLUCOSE 96 06/29/2023   NA 141 06/29/2023   K 4.0 06/29/2023   CL 103 06/29/2023   CO2 25 06/29/2023   BUN 11 06/29/2023   CREATININE 1.24 06/29/2023   EGFR 64 06/29/2023   CALCIUM  9.6 06/29/2023   PROT 6.9 06/29/2023   ALBUMIN 4.0 06/29/2023   LABGLOB 2.9 06/29/2023   AGRATIO 1.5 12/26/2022   BILITOT 0.7 06/29/2023   ALKPHOS 68 06/29/2023   AST 17 06/29/2023   ALT 17 06/29/2023   ANIONGAP 8 01/03/2019        Objective    BP 125/80   Pulse 65   Ht 6' (1.829 m)   Wt 201 lb (91.2 kg)   SpO2 98%   BMI 27.26 kg/m  BP Readings from Last 3 Encounters:  11/30/23 125/80  09/25/23 132/78  09/23/23 (!) 140/78   Wt Readings from Last 3 Encounters:  11/30/23 201 lb (91.2 kg)  09/25/23 201 lb (91.2 kg)  09/23/23 200 lb 14.4 oz (91.1 kg)        Physical Exam Vitals reviewed.  Constitutional:      General: He is not in acute distress.    Appearance: Normal appearance. He is not ill-appearing, toxic-appearing or diaphoretic.  Eyes:     Conjunctiva/sclera: Conjunctivae normal.  Cardiovascular:     Rate and Rhythm: Normal rate and regular rhythm.     Pulses: Normal pulses.     Heart sounds: Normal heart sounds. No murmur heard.    No friction rub. No gallop.  Pulmonary:     Effort: Pulmonary effort is normal. No respiratory distress.     Breath sounds: Normal breath sounds. No stridor. No wheezing, rhonchi or rales.  Abdominal:     General: Bowel sounds are normal. There is no distension.     Palpations: Abdomen is soft.     Tenderness: There is no abdominal tenderness.  Musculoskeletal:     Right lower leg: No edema.     Left lower leg: No edema.  Skin:    Findings: No erythema or rash.  Neurological:     Mental Status: He is alert and oriented to person, place, and time.  Psychiatric:        Mood and Affect: Mood and affect  normal.        Speech: Speech normal.        Behavior: Behavior normal. Behavior is cooperative.       No results found for any visits on 11/30/23.  Assessment & Plan     Problem List Items Addressed This Visit  Cardiovascular and Mediastinum   Primary hypertension - Primary     Other   Hyperlipidemia     Assessment & Plan Hypertension Chronic, well-controlled hypertension with current blood pressure at 125/80 mmHg. He adheres to the prescribed medications and actively manages his diet, contributing positively to blood pressure control. - Continue losartan  25 mg daily - Continue metoprolol  50 mg daily - Continue amlodipine  5 mg daily  Hyperlipidemia Chronic condition managed with medication. He is on a stable dose with no issues in the current treatment plan. - Continue crystal 40 mg daily     Return in about 7 months (around 07/01/2024).         Mimi Alt, MD  [facility] [phone] (phone) [phone] (fax)  [facility]

## 2023-12-08 ENCOUNTER — Other Ambulatory Visit: Payer: Self-pay | Admitting: Family Medicine

## 2023-12-10 NOTE — Telephone Encounter (Signed)
 Requested medication (s) are due for refill today: yes  Requested medication (s) are on the active medication list: yes  Last refill:  09/11/23  Future visit scheduled: no  Notes to clinic:  Unable to refill per protocol, last refill by another provider.      Requested Prescriptions  Pending Prescriptions Disp Refills   clopidogrel  (PLAVIX ) 75 MG tablet [Pharmacy Med Name: CLOPIDOGREL  75MG  TABLET] 90 tablet 0    Sig: TAKE ONE TABLET BY MOUTH ONCE A DAY     Hematology: Antiplatelets - clopidogrel  Failed - 12/10/2023  2:51 PM      Failed - PLT in normal range and within 180 days    Platelets  Date Value Ref Range Status  06/29/2023 481 (H) 150 - 450 x10E3/uL Final         Passed - HCT in normal range and within 180 days    Hematocrit  Date Value Ref Range Status  06/29/2023 40.1 37.5 - 51.0 % Final         Passed - HGB in normal range and within 180 days    Hemoglobin  Date Value Ref Range Status  06/29/2023 13.1 13.0 - 17.7 g/dL Final         Passed - Cr in normal range and within 360 days    Creatinine  Date Value Ref Range Status  04/20/2014 1.26 0.60 - 1.30 mg/dL Final   Creatinine, Ser  Date Value Ref Range Status  06/29/2023 1.24 0.76 - 1.27 mg/dL Final         Passed - Valid encounter within last 6 months    Recent Outpatient Visits           1 week ago Primary hypertension   [facility] [place], Judyann Number, MD   2 months ago Primary hypertension   [facility] [place], Judyann Number, MD

## 2023-12-15 DIAGNOSIS — S0502XA Injury of conjunctiva and corneal abrasion without foreign body, left eye, initial encounter: Secondary | ICD-10-CM | POA: Diagnosis not present

## 2023-12-22 ENCOUNTER — Other Ambulatory Visit: Payer: Self-pay | Admitting: Family Medicine

## 2023-12-25 ENCOUNTER — Other Ambulatory Visit: Payer: Self-pay | Admitting: Family Medicine

## 2023-12-28 ENCOUNTER — Ambulatory Visit: Payer: Self-pay | Admitting: Family Medicine

## 2024-01-01 ENCOUNTER — Other Ambulatory Visit: Payer: Self-pay | Admitting: Family Medicine

## 2024-01-05 ENCOUNTER — Other Ambulatory Visit: Payer: Self-pay | Admitting: Family Medicine

## 2024-01-09 ENCOUNTER — Other Ambulatory Visit: Payer: Self-pay | Admitting: Family Medicine

## 2024-03-07 ENCOUNTER — Other Ambulatory Visit: Payer: Self-pay | Admitting: Family Medicine

## 2024-03-08 ENCOUNTER — Other Ambulatory Visit: Payer: Self-pay | Admitting: Family Medicine

## 2024-03-14 ENCOUNTER — Other Ambulatory Visit: Payer: Self-pay | Admitting: Family Medicine

## 2024-04-08 ENCOUNTER — Encounter: Payer: Self-pay | Admitting: Cardiovascular Disease

## 2024-04-08 ENCOUNTER — Ambulatory Visit (INDEPENDENT_AMBULATORY_CARE_PROVIDER_SITE_OTHER): Admitting: Cardiovascular Disease

## 2024-04-08 VITALS — BP 124/72 | HR 67 | Ht 72.0 in | Wt 191.0 lb

## 2024-04-08 DIAGNOSIS — I2119 ST elevation (STEMI) myocardial infarction involving other coronary artery of inferior wall: Secondary | ICD-10-CM

## 2024-04-08 DIAGNOSIS — I10 Essential (primary) hypertension: Secondary | ICD-10-CM

## 2024-04-08 DIAGNOSIS — E782 Mixed hyperlipidemia: Secondary | ICD-10-CM

## 2024-04-08 DIAGNOSIS — I25118 Atherosclerotic heart disease of native coronary artery with other forms of angina pectoris: Secondary | ICD-10-CM

## 2024-04-08 DIAGNOSIS — I340 Nonrheumatic mitral (valve) insufficiency: Secondary | ICD-10-CM

## 2024-04-08 NOTE — Progress Notes (Signed)
 Cardiology Office Note   Date:  04/08/2024   ID:  Ryan Stafford, DOB 08-Feb-1956, MRN [id number]  PCP:  Sharma Coyer, MD  Cardiologist:  Denyse Bathe, MD      History of Present Illness: Ryan Stafford is a 68 y.o. male who presents for  Chief Complaint  Patient presents with   Follow-up    Follow up    Doing good, no complaints.      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   Overview: WITH LIMA TO THE DISTAL LAD, SVD TO RAMUS, SVG TO PL AND SVG TO PDA AT [facility]   S/P cardiac catheterization 12/12/2011   Overview: 3 VESSEL CAD     Past Surgical History:  Procedure Laterality Date   CARDIAC CATHETERIZATION N/A 04/20/2015   Procedure: Left Heart Cath and Cors/Grafts Angiography;  Surgeon: Marsa Dooms, MD;  Location: [facility] INVASIVE CV LAB;  Service: Cardiovascular;  Laterality: N/A;   CORONARY ARTERY BYPASS GRAFT     CORONARY/GRAFT ACUTE MI REVASCULARIZATION N/A 01/03/2019   Procedure: GRAFT ANGIOGRAPHY;  Surgeon: Darron Deatrice LABOR, MD;  Location: [facility] INVASIVE CV LAB;  Service: Cardiovascular;  Laterality: N/A;   LEFT HEART CATH AND CORONARY ANGIOGRAPHY N/A 01/03/2019   Procedure: LEFT HEART CATH AND CORONARY ANGIOGRAPHY;  Surgeon: Darron Deatrice LABOR, MD;  Location: [facility] INVASIVE CV LAB;  Service: Cardiovascular;  Laterality: N/A;     Current Outpatient Medications  Medication Sig Dispense Refill   acetaminophen  (TYLENOL ) 325 MG tablet Take 650 mg by mouth every 6 (six) hours as needed for mild pain.     amLODipine  (NORVASC ) 5 MG tablet TAKE ONE TABLET BY MOUTH ONCE A DAY 90 tablet 1   aspirin  EC 81 MG tablet Take 81 mg by mouth daily.      azelastine  (ASTELIN ) 0.1 % nasal spray Place 2  sprays into both nostrils 2 (two) times daily. Use in each nostril as directed (Patient not taking: Reported on 11/30/2023) 30 mL 12   benzonatate  (TESSALON ) 100 MG capsule Take 1 capsule (100 mg total) by mouth 3 (three) times daily as needed for cough. 30 capsule 0   buPROPion  (WELLBUTRIN  XL) 300 MG 24 hr tablet TAKE ONE TABLET (300 MG TOTAL) BY MOUTH DAILY. 90 tablet 1   clopidogrel  (PLAVIX ) 75 MG tablet TAKE ONE TABLET BY MOUTH ONCE A DAY 90 tablet 0   docusate sodium  (COLACE) 100 MG capsule Take 100 mg by mouth 2 (two) times daily as needed for mild constipation.     finasteride  (PROSCAR ) 5 MG tablet Take 5 mg by mouth daily.     isosorbide  mononitrate (IMDUR ) 30 MG 24 hr tablet TAKE ONE TABLET BY MOUTH ONCE A DAY 30 tablet 5   losartan  (COZAAR ) 25 MG tablet TAKE ONE TABLET BY MOUTH ONCE A DAY 30 tablet 3   metoprolol  succinate (TOPROL -XL) 50 MG 24 hr tablet TAKE ONE TABLET (50 MG TOTAL) BY MOUTH DAILY. 90 tablet 1   nitroGLYCERIN  (NITROSTAT ) 0.4 MG SL tablet DISSOLVE ONE TABLET UNDER TONGUE AS NEEDED FOR CHEST PAIN. MAY REPEAT FIVE MINUTES APART THREE TIMES IF NEEDED (Patient taking differently: as needed.) 50 tablet 0   rosuvastatin  (CRESTOR ) 40  MG tablet TAKE ONE TABLET (40 MG TOTAL) BY MOUTH DAILY. 90 tablet 3   tamsulosin  (FLOMAX ) 0.4 MG CAPS capsule TAKE ONE CAPSULE BY MOUTH EVERY DAY 30 MINS FOLLOWING THE SAME MEAL EACH DAY 30 capsule 3   traZODone  (DESYREL ) 50 MG tablet TAKE ONE TABLET (50 MG TOTAL) BY MOUTH AT BEDTIME. 30 tablet 2   No current facility-administered medications for this visit.    Allergies:   Patient has no known allergies.    Social History:   reports that he quit smoking about 12 years ago. His smoking use included cigarettes. He started smoking about 56 years ago. He has a 64.5 pack-year smoking history. He has quit using smokeless tobacco. He reports that he does not currently use alcohol. He reports that he does not use drugs.   Family History:  family history  includes Hypertension in his brother and father.    ROS:     Review of Systems  Constitutional: Negative.   HENT: Negative.    Eyes: Negative.   Respiratory: Negative.    Gastrointestinal: Negative.   Genitourinary: Negative.   Musculoskeletal: Negative.   Skin: Negative.   Neurological: Negative.   Endo/Heme/Allergies: Negative.   Psychiatric/Behavioral: Negative.    All other systems reviewed and are negative.     All other systems are reviewed and negative.    PHYSICAL EXAM: VS:  BP 124/72   Pulse 67   Ht 6' (1.829 m)   Wt 191 lb (86.6 kg)   SpO2 96%   BMI 25.90 kg/m  , BMI Body mass index is 25.9 kg/m. Last weight:  Wt Readings from Last 3 Encounters:  04/08/24 191 lb (86.6 kg)  11/30/23 201 lb (91.2 kg)  09/25/23 201 lb (91.2 kg)     Physical Exam Vitals reviewed.  Constitutional:      Appearance: Normal appearance. He is normal weight.  HENT:     Head: Normocephalic.     Nose: Nose normal.     Mouth/Throat:     Mouth: Mucous membranes are moist.  Eyes:     Pupils: Pupils are equal, round, and reactive to light.  Cardiovascular:     Rate and Rhythm: Normal rate and regular rhythm.     Pulses: Normal pulses.     Heart sounds: Normal heart sounds.  Pulmonary:     Effort: Pulmonary effort is normal.  Abdominal:     General: Abdomen is flat. Bowel sounds are normal.  Musculoskeletal:        General: Normal range of motion.     Cervical back: Normal range of motion.  Skin:    General: Skin is warm.  Neurological:     General: No focal deficit present.     Mental Status: He is alert.  Psychiatric:        Mood and Affect: Mood normal.       EKG:   Recent Labs: 06/29/2023: ALT 17; BUN 11; Creatinine, Ser 1.24; Hemoglobin 13.1; Platelets 481; Potassium 4.0; Sodium 141    Lipid Panel    Component Value Date/Time   CHOL 153 09/23/2023 0954   CHOL 166 12/13/2011 0536   TRIG 95 09/23/2023 0954   TRIG 115 12/13/2011 0536   HDL 56 09/23/2023  0954   HDL 39 (L) 12/13/2011 0536   CHOLHDL 2.7 09/23/2023 0954   VLDL 23 12/13/2011 0536   LDLCALC 79 09/23/2023 0954   LDLCALC 104 (H) 12/13/2011 0536      Other studies Reviewed: Additional studies/  records that were reviewed today include:  Review of the above records demonstrates:       No data to display            ASSESSMENT AND PLAN:    ICD-10-CM   1. Coronary artery disease of native heart with stable angina pectoris, unspecified vessel or lesion type (HCC)  I25.118    No chest pains and SOB.    2. Primary hypertension  I10     3. Mixed hyperlipidemia  E78.2     4. Acute ST elevation myocardial infarction (STEMI) of inferolateral wall (HCC)  I21.19     5. Nonrheumatic mitral valve regurgitation  I34.0        Problem List Items Addressed This Visit       Cardiovascular and Mediastinum   Acute ST elevation myocardial infarction (STEMI) of inferolateral wall (HCC)   Coronary artery disease - Primary   Primary hypertension     Other   Hyperlipidemia   Other Visit Diagnoses       Nonrheumatic mitral valve regurgitation              Disposition:   Return in about 3 months (around 07/09/2024).    Total time spent: 30 minutes  Signed,  Denyse Bathe, MD  04/08/2024 11:02 AM    [facility]

## 2024-05-24 ENCOUNTER — Ambulatory Visit: Admitting: Family Medicine

## 2024-05-24 ENCOUNTER — Encounter: Payer: Self-pay | Admitting: Family Medicine

## 2024-05-24 VITALS — BP 128/78 | HR 54 | Ht 72.0 in | Wt 193.7 lb

## 2024-05-24 DIAGNOSIS — F17210 Nicotine dependence, cigarettes, uncomplicated: Secondary | ICD-10-CM | POA: Diagnosis not present

## 2024-05-24 DIAGNOSIS — N401 Enlarged prostate with lower urinary tract symptoms: Secondary | ICD-10-CM

## 2024-05-24 DIAGNOSIS — R350 Frequency of micturition: Secondary | ICD-10-CM | POA: Diagnosis not present

## 2024-05-24 DIAGNOSIS — I509 Heart failure, unspecified: Secondary | ICD-10-CM | POA: Insufficient documentation

## 2024-05-24 DIAGNOSIS — E782 Mixed hyperlipidemia: Secondary | ICD-10-CM | POA: Diagnosis not present

## 2024-05-24 DIAGNOSIS — Z6826 Body mass index (BMI) 26.0-26.9, adult: Secondary | ICD-10-CM | POA: Diagnosis not present

## 2024-05-24 DIAGNOSIS — F3341 Major depressive disorder, recurrent, in partial remission: Secondary | ICD-10-CM | POA: Diagnosis not present

## 2024-05-24 DIAGNOSIS — I25119 Atherosclerotic heart disease of native coronary artery with unspecified angina pectoris: Secondary | ICD-10-CM | POA: Diagnosis not present

## 2024-05-24 DIAGNOSIS — N529 Male erectile dysfunction, unspecified: Secondary | ICD-10-CM | POA: Diagnosis not present

## 2024-05-24 DIAGNOSIS — Z008 Encounter for other general examination: Secondary | ICD-10-CM | POA: Diagnosis not present

## 2024-05-24 DIAGNOSIS — I10 Essential (primary) hypertension: Secondary | ICD-10-CM

## 2024-05-24 DIAGNOSIS — I25118 Atherosclerotic heart disease of native coronary artery with other forms of angina pectoris: Secondary | ICD-10-CM | POA: Diagnosis not present

## 2024-05-24 DIAGNOSIS — E785 Hyperlipidemia, unspecified: Secondary | ICD-10-CM | POA: Diagnosis not present

## 2024-05-24 DIAGNOSIS — E663 Overweight: Secondary | ICD-10-CM | POA: Diagnosis not present

## 2024-05-24 MED ORDER — SILDENAFIL CITRATE 100 MG PO TABS: 50.0000 mg | ORAL_TABLET | Freq: Every day | ORAL | 5 refills | Status: DC | PRN

## 2024-05-24 NOTE — Progress Notes (Signed)
 Established patient visit   Patient: Ryan Stafford   DOB: 01-15-1956   68 y.o. Male  MRN: [id number] Visit Date: 05/24/2024  Today's healthcare provider: Rockie Agent, MD   Chief Complaint  Patient presents with   Medication Refill    Patient is present to get medication refills of sildenafil , not currently on med list.    Subjective     HPI     Medication Refill    Additional comments: Patient is present to get medication refills of sildenafil , not currently on med list.       Last edited by Cherry Chiquita HERO, CMA on 05/24/2024  9:32 AM.       Discussed the use of AI scribe software for clinical note transcription with the patient, who gave verbal consent to proceed.  History of Present Illness Ryan Stafford is a 68 year old male with hypertension and coronary artery disease who presents for medication refills and elevated blood pressure.  He has an elevated blood pressure reading of 150/92 mmHg and reports that his blood pressure tends to rise during clinic visits. He confirms taking his medication this morning. His current antihypertensive regimen includes losartan  25 mg daily, amlodipine  5 mg daily, and metoprolol  50 mg daily.  He is requesting a refill of sildenafil  100 mg, which he uses as needed for erectile dysfunction. He confirms the medication is effective.  His past medical history includes coronary artery disease, for which he takes Plavix  75 mg daily, Imdur  30 mg daily, and nitroglycerin  0.4 mg as needed. He has a history of STEMI and continues to follow up with cardiology. No recent chest pain or need for nitroglycerin .  He has chronic hyperlipidemia managed with Crestor  40 mg daily. He also takes finasteride  5 mg and Flomax  0.4 mg for prostate issues.  He quit smoking around the time of his heart attack in 2015. He acknowledges a decrease in physical activity, noting that he has not been walking as much this year. He has been referred for a  low-dose chest CT screening for lung cancer, but reports that it has not yet been scheduled.     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    buPROPion  (WELLBUTRIN  XL) 300 MG 24 hr tablet TAKE ONE TABLET (300 MG TOTAL) BY MOUTH DAILY.   clopidogrel  (PLAVIX ) 75 MG tablet TAKE ONE TABLET BY MOUTH ONCE A DAY   finasteride  (PROSCAR ) 5 MG tablet Take 5 mg by mouth daily.   isosorbide  mononitrate (IMDUR ) 30 MG 24 hr tablet TAKE ONE TABLET BY MOUTH ONCE A DAY   losartan  (COZAAR ) 25 MG tablet TAKE ONE TABLET BY MOUTH ONCE A DAY   metoprolol  succinate (TOPROL -XL) 50 MG 24 hr tablet TAKE ONE TABLET (50 MG TOTAL) BY  MOUTH DAILY.   nitroGLYCERIN  (NITROSTAT ) 0.4 MG SL tablet DISSOLVE ONE TABLET UNDER TONGUE AS NEEDED FOR CHEST PAIN. MAY REPEAT FIVE MINUTES APART THREE TIMES IF NEEDED (Patient taking differently: as needed.)   rosuvastatin  (CRESTOR ) 40 MG tablet TAKE ONE TABLET (40 MG TOTAL) BY MOUTH DAILY.   tamsulosin  (FLOMAX ) 0.4 MG CAPS capsule TAKE ONE CAPSULE BY MOUTH EVERY DAY 30 MINS FOLLOWING THE SAME MEAL EACH DAY   traZODone  (DESYREL ) 50 MG tablet TAKE ONE TABLET (50 MG TOTAL) BY MOUTH AT BEDTIME.   [DISCONTINUED] acetaminophen  (TYLENOL ) 325 MG tablet Take 650 mg by mouth every 6 (six) hours as needed for mild pain. (Patient not taking: Reported on 05/24/2024)    [DISCONTINUED] amLODipine  (NORVASC ) 5 MG tablet TAKE ONE TABLET BY MOUTH ONCE A DAY (Patient not taking: Reported on 05/24/2024)   [DISCONTINUED] azelastine  (ASTELIN ) 0.1 % nasal spray Place 2 sprays into both nostrils 2 (two) times daily. Use in each nostril as directed (Patient not taking: Reported on 05/24/2024)   [DISCONTINUED] benzonatate  (TESSALON ) 100 MG capsule Take 1 capsule (100 mg total) by mouth 3 (three) times daily as needed for cough. (Patient not taking: Reported on 05/24/2024)   [DISCONTINUED] docusate sodium  (COLACE) 100 MG capsule Take 100 mg by mouth 2 (two) times daily as needed for mild constipation. (Patient not taking: Reported on 05/24/2024)   No facility-administered medications prior to visit.    Review of Systems  Last CBC Lab Results  Component Value Date   WBC 3.5 06/29/2023   HGB 13.1 06/29/2023   HCT 40.1 06/29/2023   MCV 98 (H) 06/29/2023   MCH 32.0 06/29/2023   RDW 11.8 06/29/2023   PLT 481 (H) 06/29/2023   Last metabolic panel Lab Results  Component Value Date   GLUCOSE 96 06/29/2023   NA 141 06/29/2023   K 4.0 06/29/2023   CL 103 06/29/2023   CO2 25 06/29/2023   BUN 11 06/29/2023   CREATININE 1.24 06/29/2023   EGFR 64 06/29/2023   CALCIUM  9.6 06/29/2023   PROT 6.9 06/29/2023   ALBUMIN 4.0 06/29/2023   LABGLOB 2.9 06/29/2023   AGRATIO 1.5 12/26/2022   BILITOT 0.7 06/29/2023   ALKPHOS 68 06/29/2023   AST 17 06/29/2023   ALT 17 06/29/2023   ANIONGAP 8 01/03/2019        Objective    BP 128/78 (BP Location: Left Arm, Patient Position: Sitting, Cuff Size: Normal)   Pulse (!) 54   Ht 6' (1.829 m)   Wt 193 lb 11.2 oz (87.9 kg)   SpO2 100%   BMI 26.27 kg/m  BP Readings from Last 3 Encounters:  05/24/24 128/78  04/08/24 124/72  11/30/23 125/80   Wt Readings from Last 3 Encounters:  05/24/24 193 lb 11.2 oz (87.9 kg)  04/08/24 191 lb (86.6 kg)  11/30/23 201 lb (91.2 kg)        Physical Exam Vitals reviewed.  Constitutional:       General: He is not in acute distress.    Appearance: Normal appearance. He is not ill-appearing.  Cardiovascular:     Rate and Rhythm: Normal rate and regular rhythm.  Pulmonary:     Effort: Pulmonary effort is normal. No respiratory distress.     Breath sounds: No wheezing, rhonchi or rales.  Musculoskeletal:     Right lower leg: No edema.  Neurological:     Mental Status: He is alert and oriented to person, place, and time.  Psychiatric:  Mood and Affect: Mood normal.        Behavior: Behavior normal.       No results found for any visits on 05/24/24.  Assessment & Plan     Problem List Items Addressed This Visit     Benign prostatic hyperplasia with urinary frequency   Benign prostatic hyperplasia with lower urinary tract symptoms Chronic condition  Managed with finasteride  and Flomax . - Continue finasteride  5 mg daily - Continue Flomax  0.4 mg daily      Coronary artery disease   Coronary artery disease with history of STEMI Chronic coronary artery disease with STEMI in 2015. Managed with Plavix , Imdur , metoprolol , and nitroglycerin  as needed. No recent chest pain or nitroglycerin  use. Continues cardiology follow-up. - Continue Plavix  75 mg daily - Continue Imdur  30 mg daily - Continue metoprolol  50 mg daily - Continue nitroglycerin  0.4 mg as needed - Follow up with cardiology as scheduled      Relevant Medications   sildenafil  (VIAGRA ) 100 MG tablet   Hyperlipidemia   Chronic hyperlipidemia managed with Crestor . - Continue Crestor  40 mg daily      Relevant Medications   sildenafil  (VIAGRA ) 100 MG tablet   Primary hypertension   Chronic condition  Blood pressure initially elevated at 150/92, improved to 128/78 upon recheck. Managed with losartan , metoprolol , amlodipine , and lifestyle modifications. - Continue losartan  25 mg daily - Continue metoprolol  50 mg daily - Continue amlodipine  5 mg daily - Encourage regular exercise and lifestyle  modifications      Relevant Medications   sildenafil  (VIAGRA ) 100 MG tablet   Smoking greater than 40 pack years   Lung cancer screening recommended due to smoking history. Smoking cessation since 2015 after heart attack. - Schedule low dose chest CT for lung cancer screening      Vasculogenic erectile dysfunction - Primary   Chronic condition   Managed with sildenafil  100 mg as needed. Effective without significant hypotension despite concurrent Imdur  use. - Refill sildenafil  100 mg as needed      Relevant Medications   sildenafil  (VIAGRA ) 100 MG tablet     Assessment & Plan      Return in about 6 weeks (around 07/04/2024).         Rockie Agent, MD  [facility] [phone] (phone) [phone] (fax)  [facility]

## 2024-05-24 NOTE — Patient Instructions (Signed)
 To keep you healthy, please keep in mind the following health maintenance items that you are due for:   Health Maintenance Due  Topic Date Due   DTaP/Tdap/Td (1 - Tdap) Never done   Lung Cancer Screening  Never done   Zoster Vaccines- Shingrix (1 of 2) Never done   Medicare Annual Wellness (AWV)  06/28/2024     Best Wishes,   Dr. Lang

## 2024-05-26 ENCOUNTER — Other Ambulatory Visit: Payer: Self-pay | Admitting: Family Medicine

## 2024-06-01 ENCOUNTER — Encounter: Payer: Self-pay | Admitting: Family Medicine

## 2024-06-01 DIAGNOSIS — N401 Enlarged prostate with lower urinary tract symptoms: Secondary | ICD-10-CM | POA: Insufficient documentation

## 2024-06-01 NOTE — Assessment & Plan Note (Signed)
 Chronic condition   Managed with sildenafil  100 mg as needed. Effective without significant hypotension despite concurrent Imdur  use. - Refill sildenafil  100 mg as needed

## 2024-06-01 NOTE — Assessment & Plan Note (Signed)
 Benign prostatic hyperplasia with lower urinary tract symptoms Chronic condition  Managed with finasteride  and Flomax . - Continue finasteride  5 mg daily - Continue Flomax  0.4 mg daily

## 2024-06-01 NOTE — Assessment & Plan Note (Signed)
 Chronic hyperlipidemia managed with Crestor . - Continue Crestor  40 mg daily

## 2024-06-01 NOTE — Assessment & Plan Note (Signed)
 Lung cancer screening recommended due to smoking history. Smoking cessation since 2015 after heart attack. - Schedule low dose chest CT for lung cancer screening

## 2024-06-01 NOTE — Assessment & Plan Note (Signed)
 Chronic condition  Blood pressure initially elevated at 150/92, improved to 128/78 upon recheck. Managed with losartan , metoprolol , amlodipine , and lifestyle modifications. - Continue losartan  25 mg daily - Continue metoprolol  50 mg daily - Continue amlodipine  5 mg daily - Encourage regular exercise and lifestyle modifications

## 2024-06-01 NOTE — Assessment & Plan Note (Signed)
 Coronary artery disease with history of STEMI Chronic coronary artery disease with STEMI in 2015. Managed with Plavix , Imdur , metoprolol , and nitroglycerin  as needed. No recent chest pain or nitroglycerin  use. Continues cardiology follow-up. - Continue Plavix  75 mg daily - Continue Imdur  30 mg daily - Continue metoprolol  50 mg daily - Continue nitroglycerin  0.4 mg as needed - Follow up with cardiology as scheduled

## 2024-06-06 ENCOUNTER — Other Ambulatory Visit: Payer: Self-pay | Admitting: Family Medicine

## 2024-06-06 ENCOUNTER — Other Ambulatory Visit: Payer: Self-pay | Admitting: Cardiovascular Disease

## 2024-06-27 ENCOUNTER — Other Ambulatory Visit: Payer: Self-pay | Admitting: Family Medicine

## 2024-07-01 ENCOUNTER — Encounter: Payer: Self-pay | Admitting: Cardiovascular Disease

## 2024-07-01 ENCOUNTER — Ambulatory Visit (INDEPENDENT_AMBULATORY_CARE_PROVIDER_SITE_OTHER): Admitting: Cardiovascular Disease

## 2024-07-01 VITALS — BP 132/64 | HR 69 | Ht 72.0 in | Wt 196.0 lb

## 2024-07-01 DIAGNOSIS — I25118 Atherosclerotic heart disease of native coronary artery with other forms of angina pectoris: Secondary | ICD-10-CM | POA: Diagnosis not present

## 2024-07-01 DIAGNOSIS — E782 Mixed hyperlipidemia: Secondary | ICD-10-CM | POA: Diagnosis not present

## 2024-07-01 DIAGNOSIS — I340 Nonrheumatic mitral (valve) insufficiency: Secondary | ICD-10-CM

## 2024-07-01 DIAGNOSIS — I2119 ST elevation (STEMI) myocardial infarction involving other coronary artery of inferior wall: Secondary | ICD-10-CM | POA: Diagnosis not present

## 2024-07-01 DIAGNOSIS — I10 Essential (primary) hypertension: Secondary | ICD-10-CM | POA: Diagnosis not present

## 2024-07-01 MED ORDER — NITROGLYCERIN 0.4 MG SL SUBL: 0.4000 mg | SUBLINGUAL_TABLET | SUBLINGUAL | 2 refills | Status: AC | PRN

## 2024-07-01 NOTE — Progress Notes (Signed)
 Cardiology Office Note   Date:  07/01/2024   ID:  Ryan Stafford, DOB 12-07-1955, MRN [id number]  PCP:  Sharma Coyer, MD  Cardiologist:  Denyse Bathe, MD      History of Present Illness: Ryan Stafford is a 68 y.o. male who presents for  Chief Complaint  Patient presents with   Follow-up    3 months follow up     No chest pain or SOB.      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   Overview: WITH LIMA TO THE DISTAL LAD, SVD TO RAMUS, SVG TO PL AND SVG TO PDA AT [facility]   S/P cardiac catheterization 12/12/2011   Overview: 3 VESSEL CAD     Past Surgical History:  Procedure Laterality Date   CARDIAC CATHETERIZATION N/A 04/20/2015   Procedure: Left Heart Cath and Cors/Grafts Angiography;  Surgeon: Marsa Dooms, MD;  Location: [facility];  Service: Cardiovascular;  Laterality: N/A;   CORONARY ARTERY BYPASS GRAFT     CORONARY/GRAFT ACUTE MI REVASCULARIZATION N/A 01/03/2019   Procedure: GRAFT ANGIOGRAPHY;  Surgeon: Darron Deatrice LABOR, MD;  Location: [facility];  Service: Cardiovascular;  Laterality: N/A;   LEFT HEART CATH AND CORONARY ANGIOGRAPHY N/A 01/03/2019   Procedure: LEFT HEART CATH AND CORONARY ANGIOGRAPHY;  Surgeon: Darron Deatrice LABOR, MD;  Location: [facility];  Service: Cardiovascular;  Laterality: N/A;     Current Outpatient Medications  Medication Sig Dispense Refill   amLODipine  (NORVASC ) 5 MG tablet TAKE ONE TABLET BY MOUTH ONCE A DAY 90 tablet 3   aspirin  EC 81 MG tablet Take 81 mg by mouth daily.      buPROPion  (WELLBUTRIN  XL) 300 MG 24 hr tablet TAKE ONE TABLET (300  MG TOTAL) BY MOUTH DAILY. 90 tablet 1   clopidogrel  (PLAVIX ) 75 MG tablet TAKE ONE TABLET BY MOUTH ONCE A DAY 90 tablet 0   finasteride  (PROSCAR ) 5 MG tablet Take 5 mg by mouth daily.     isosorbide  mononitrate (IMDUR ) 30 MG 24 hr tablet TAKE ONE TABLET BY MOUTH ONCE A DAY 30 tablet 5   losartan  (COZAAR ) 25 MG tablet TAKE ONE TABLET BY MOUTH ONCE A DAY 30 tablet 3   metoprolol  succinate (TOPROL -XL) 50 MG 24 hr tablet TAKE ONE TABLET (50 MG TOTAL) BY MOUTH DAILY. 90 tablet 1   nitroGLYCERIN  (NITROSTAT ) 0.4 MG SL tablet Place 1 tablet (0.4 mg total) under the tongue every 5 (five) minutes as needed for chest pain. DISSOLVE ONE TABLET UNDER TONGUE AS NEEDED FOR CHEST PAIN. MAY REPEAT FIVE MINUTES APART THREE TIMES IF NEEDED 50 tablet 2   rosuvastatin  (CRESTOR ) 40 MG tablet TAKE ONE TABLET (40 MG TOTAL) BY MOUTH DAILY. 90 tablet 3   tamsulosin  (FLOMAX ) 0.4 MG CAPS capsule TAKE ONE CAPSULE BY MOUTH EVERY DAY 30 MINS FOLLOWING THE SAME MEAL EACH DAY 30 capsule 3   traZODone  (DESYREL ) 50 MG tablet TAKE ONE TABLET (50 MG TOTAL) BY MOUTH AT BEDTIME. 30 tablet 2   No current facility-administered medications for this visit.    Allergies:   Patient has  no known allergies.    Social History:   reports that he quit smoking about 13 years ago. His smoking use included cigarettes. He started smoking about 56 years ago. He has a 64.5 pack-year smoking history. He has quit using smokeless tobacco. He reports that he does not currently use alcohol. He reports that he does not use drugs.   Family History:  family history includes Hypertension in his brother and father.    ROS:     Review of Systems  Constitutional: Negative.   HENT: Negative.    Eyes: Negative.   Respiratory: Negative.    Gastrointestinal: Negative.   Genitourinary: Negative.   Musculoskeletal: Negative.   Skin: Negative.   Neurological: Negative.   Endo/Heme/Allergies: Negative.   Psychiatric/Behavioral: Negative.    All other  systems reviewed and are negative.     All other systems are reviewed and negative.    PHYSICAL EXAM: VS:  BP 132/64   Pulse 69   Ht 6' (1.829 m)   Wt 196 lb (88.9 kg)   SpO2 96%   BMI 26.58 kg/m  , BMI Body mass index is 26.58 kg/m. Last weight:  Wt Readings from Last 3 Encounters:  07/01/24 196 lb (88.9 kg)  05/24/24 193 lb 11.2 oz (87.9 kg)  04/08/24 191 lb (86.6 kg)     Physical Exam Vitals reviewed.  Constitutional:      Appearance: Normal appearance. He is normal weight.  HENT:     Head: Normocephalic.     Nose: Nose normal.     Mouth/Throat:     Mouth: Mucous membranes are moist.  Eyes:     Pupils: Pupils are equal, round, and reactive to light.  Cardiovascular:     Rate and Rhythm: Normal rate and regular rhythm.     Pulses: Normal pulses.     Heart sounds: Normal heart sounds.  Pulmonary:     Effort: Pulmonary effort is normal.  Abdominal:     General: Abdomen is flat. Bowel sounds are normal.  Musculoskeletal:        General: Normal range of motion.     Cervical back: Normal range of motion.  Skin:    General: Skin is warm.  Neurological:     General: No focal deficit present.     Mental Status: He is alert.  Psychiatric:        Mood and Affect: Mood normal.       EKG:   Recent Labs: No results found for requested labs within last 365 days.    Lipid Panel    Component Value Date/Time   CHOL 153 09/23/2023 0954   CHOL 166 12/13/2011 0536   TRIG 95 09/23/2023 0954   TRIG 115 12/13/2011 0536   HDL 56 09/23/2023 0954   HDL 39 (L) 12/13/2011 0536   CHOLHDL 2.7 09/23/2023 0954   VLDL 23 12/13/2011 0536   LDLCALC 79 09/23/2023 0954   LDLCALC 104 (H) 12/13/2011 0536      Other studies Reviewed: Additional studies/ records that were reviewed today include:  Review of the above records demonstrates:       No data to display            ASSESSMENT AND PLAN:    ICD-10-CM   1. Coronary artery disease of native heart with  stable angina pectoris, unspecified vessel or lesion type  I25.118 nitroGLYCERIN  (NITROSTAT ) 0.4 MG SL tablet    PCV ECHOCARDIOGRAM COMPLETE    MYOCARDIAL PERFUSION IMAGING  2. Primary hypertension  I10 nitroGLYCERIN  (NITROSTAT ) 0.4 MG SL tablet    PCV ECHOCARDIOGRAM COMPLETE    MYOCARDIAL PERFUSION IMAGING    3. Mixed hyperlipidemia  E78.2 nitroGLYCERIN  (NITROSTAT ) 0.4 MG SL tablet    PCV ECHOCARDIOGRAM COMPLETE    MYOCARDIAL PERFUSION IMAGING    4. Acute ST elevation myocardial infarction (STEMI) of inferolateral wall (HCC)  I21.19 nitroGLYCERIN  (NITROSTAT ) 0.4 MG SL tablet    PCV ECHOCARDIOGRAM COMPLETE    MYOCARDIAL PERFUSION IMAGING    5. Nonrheumatic mitral valve regurgitation  I34.0 nitroGLYCERIN  (NITROSTAT ) 0.4 MG SL tablet    PCV ECHOCARDIOGRAM COMPLETE    MYOCARDIAL PERFUSION IMAGING       Problem List Items Addressed This Visit       Cardiovascular and Mediastinum   Coronary artery disease - Primary   Relevant Medications   nitroGLYCERIN  (NITROSTAT ) 0.4 MG SL tablet   Other Relevant Orders   PCV ECHOCARDIOGRAM COMPLETE   MYOCARDIAL PERFUSION IMAGING   Primary hypertension   Relevant Medications   nitroGLYCERIN  (NITROSTAT ) 0.4 MG SL tablet   Other Relevant Orders   PCV ECHOCARDIOGRAM COMPLETE   MYOCARDIAL PERFUSION IMAGING     Other   Hyperlipidemia   Relevant Medications   nitroGLYCERIN  (NITROSTAT ) 0.4 MG SL tablet   Other Relevant Orders   PCV ECHOCARDIOGRAM COMPLETE   MYOCARDIAL PERFUSION IMAGING   Other Visit Diagnoses       Acute ST elevation myocardial infarction (STEMI) of inferolateral wall (HCC)       Relevant Medications   nitroGLYCERIN  (NITROSTAT ) 0.4 MG SL tablet   Other Relevant Orders   PCV ECHOCARDIOGRAM COMPLETE   MYOCARDIAL PERFUSION IMAGING     Nonrheumatic mitral valve regurgitation       Relevant Medications   nitroGLYCERIN  (NITROSTAT ) 0.4 MG SL tablet   Other Relevant Orders   PCV ECHOCARDIOGRAM COMPLETE   MYOCARDIAL  PERFUSION IMAGING          Disposition:   No follow-ups on file.    Total time spent: 40 minutes  Signed,  Denyse Bathe, MD  07/01/2024 11:20 AM    [facility]

## 2024-07-04 ENCOUNTER — Encounter: Payer: Self-pay | Admitting: Family Medicine

## 2024-07-04 ENCOUNTER — Ambulatory Visit (INDEPENDENT_AMBULATORY_CARE_PROVIDER_SITE_OTHER): Admitting: Family Medicine

## 2024-07-04 VITALS — BP 134/85 | HR 57 | Temp 98.1°F | Ht 72.0 in | Wt 196.4 lb

## 2024-07-04 DIAGNOSIS — R350 Frequency of micturition: Secondary | ICD-10-CM

## 2024-07-04 DIAGNOSIS — I5022 Chronic systolic (congestive) heart failure: Secondary | ICD-10-CM

## 2024-07-04 DIAGNOSIS — E782 Mixed hyperlipidemia: Secondary | ICD-10-CM

## 2024-07-04 DIAGNOSIS — N401 Enlarged prostate with lower urinary tract symptoms: Secondary | ICD-10-CM

## 2024-07-04 DIAGNOSIS — Z0000 Encounter for general adult medical examination without abnormal findings: Secondary | ICD-10-CM

## 2024-07-04 DIAGNOSIS — I10 Essential (primary) hypertension: Secondary | ICD-10-CM

## 2024-07-04 DIAGNOSIS — Z0001 Encounter for general adult medical examination with abnormal findings: Secondary | ICD-10-CM | POA: Diagnosis not present

## 2024-07-04 DIAGNOSIS — I25118 Atherosclerotic heart disease of native coronary artery with other forms of angina pectoris: Secondary | ICD-10-CM

## 2024-07-04 DIAGNOSIS — Z130 Encounter for screening for diseases of the blood and blood-forming organs and certain disorders involving the immune mechanism: Secondary | ICD-10-CM

## 2024-07-04 DIAGNOSIS — F17210 Nicotine dependence, cigarettes, uncomplicated: Secondary | ICD-10-CM

## 2024-07-04 DIAGNOSIS — N529 Male erectile dysfunction, unspecified: Secondary | ICD-10-CM

## 2024-07-04 DIAGNOSIS — Z131 Encounter for screening for diabetes mellitus: Secondary | ICD-10-CM

## 2024-07-04 NOTE — Patient Instructions (Signed)
 To keep you healthy, please keep in mind the following health maintenance items that you are due for:   Health Maintenance Due  Topic Date Due   DTaP/Tdap/Td (1 - Tdap) Never done   Lung Cancer Screening  Never done   Zoster Vaccines- Shingrix (1 of 2) Never done     Best Wishes,   Dr. Lang

## 2024-07-04 NOTE — Progress Notes (Signed)
 Chief Complaint  Patient presents with   Annual Exam    Patient is present for Awv with PCP.  Diet is normal and healthy, walking for exercise.  Tdap and shingles due, advised to get at pharmacy. Lung screening due, spoke about this with patient and states he will think on it.      Subjective:   Ryan Stafford is a 68 y.o. male who presents for a Medicare Annual Wellness Visit.  Allergies (verified) Patient has no known allergies.   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   Overview: WITH LIMA TO THE DISTAL LAD, SVD TO RAMUS, SVG TO PL AND SVG TO PDA AT [facility]   S/P cardiac catheterization 12/12/2011   Overview: 3 VESSEL CAD   Past Surgical History:  Procedure Laterality Date   CARDIAC CATHETERIZATION N/A 04/20/2015   Procedure: Left Heart Cath and Cors/Grafts Angiography;  Surgeon: Marsa Dooms, MD;  Location: [facility];  Service: Cardiovascular;  Laterality: N/A;   CORONARY ARTERY BYPASS GRAFT     CORONARY/GRAFT ACUTE MI REVASCULARIZATION N/A 01/03/2019   Procedure: GRAFT ANGIOGRAPHY;  Surgeon: Darron Deatrice LABOR, MD;  Location: [facility];  Service: Cardiovascular;  Laterality: N/A;   LEFT HEART CATH AND CORONARY ANGIOGRAPHY N/A 01/03/2019   Procedure: LEFT HEART CATH AND CORONARY ANGIOGRAPHY;  Surgeon: Darron Deatrice LABOR, MD;  Location: [facility];  Service: Cardiovascular;  Laterality: N/A;   Family History  Problem Relation Age of Onset   Hypertension Father    Hypertension Brother    Social History   Occupational History   Occupation: retired    Comment: retired from development worker, [employer] in [employer], 2022  Tobacco Use   Smoking status: Former    Current packs/day: 0.00    Average packs/day: 1.5 packs/day for 43.0 years (64.5 ttl pk-yrs)    Types: Cigarettes    Start date: 04/19/1968    Quit date: 04/20/2011    Years since quitting: 13.2   Smokeless tobacco: Former  Substance and Sexual Activity   Alcohol use: Not Currently   Drug use: Never   Sexual activity: Yes   Tobacco Counseling Counseling given: Not Answered  SDOH Screenings   Food Insecurity: No Food Insecurity (06/29/2023)  Housing: Low Risk  (06/29/2023)  Transportation Needs: No Transportation Needs (06/29/2023)  Utilities: Not At Risk (06/29/2023)  Alcohol Screen: Low Risk  (12/26/2022)  Depression (PHQ2-9): Low Risk  (07/04/2024)  Financial Resource Strain: Medium Risk (06/29/2023)  Physical Activity: Sufficiently Active (06/29/2023)  Social Connections: Moderately Integrated (06/29/2023)  Stress: No Stress Concern Present (06/29/2023)  Tobacco Use: Medium Risk (07/04/2024)  Health Literacy: Adequate Health Literacy (06/29/2023)   See flowsheets for full screening details  Depression Screen PHQ 2 & 9 Depression Scale- Over the past 2 weeks, how often have you been bothered by any of the following problems? Little interest or pleasure in doing things: 0 Feeling down, depressed, or hopeless (PHQ Adolescent also includes...irritable): 0 PHQ-2 Total Score: 0 Trouble falling or staying asleep, or sleeping too much: 0 Feeling tired or having little energy: 1 Poor appetite  or overeating (PHQ Adolescent also includes...weight loss): 0 Feeling bad about yourself - or that you are a failure or have let yourself or your family down: 0 Trouble concentrating on things, such as reading the newspaper or watching television (PHQ Adolescent also includes...like school work): 0 Moving or speaking so slowly that other people could have noticed. Or the opposite - being so fidgety or restless that you have  been moving around a lot more than usual: 0 Thoughts that you would be better off dead, or of hurting yourself in some way: 0 PHQ-9 Total Score: 1 If you checked off any problems, how difficult have these problems made it for you to do your work, take care of things at home, or get along with other people?: Not difficult at all     Goals Addressed   None    Visit info / Clinical Intake: Medicare Wellness Visit Type:: Subsequent Annual Wellness Visit Persons participating in visit:: patient Medicare Wellness Visit Mode:: In-person (required for WTM) Information given by:: patient Interpreter Needed?: No Pre-visit prep was completed: no AWV questionnaire completed by patient prior to visit?: no Living arrangements:: lives with spouse/significant other Patient's Overall Health Status Rating: very good Typical amount of pain: some Does pain affect daily life?: no Are you currently prescribed opioids?: no  Dietary Habits and Nutritional Risks How many meals a day?: 3 Eats fruit and vegetables daily?: yes Most meals are obtained by: preparing own meals In the last 2 weeks, have you had any of the following?: none Diabetic:: no  Functional Status Activities of Daily Living (to include ambulation/medication): Independent Ambulation: Independent Medication Administration: Independent Home Management: Independent Manage your own finances?: yes Primary transportation is: driving Concerns about vision?: no *vision screening is required for WTM* (wears readers) Concerns about hearing?: no  Fall Screening Falls in the past year?: 0 Number of falls in past year: 0 Was there an injury with Fall?: 0 Fall Risk Category Calculator: 0 Patient Fall Risk Level: Low Fall Risk  Fall Risk Patient at Risk for Falls Due to: No Fall Risks Fall risk Follow up: Falls evaluation completed  Home and Transportation Safety: All rugs have non-skid backing?: yes All stairs or steps have railings?:  yes Grab bars in the bathtub or shower?: yes Have non-skid surface in bathtub or shower?: yes Good home lighting?: yes Regular seat belt use?: yes Hospital stays in the last year:: no  Cognitive Assessment Difficulty concentrating, remembering, or making decisions? : no Will 6CIT or Mini Cog be Completed: yes  Advance Directives (For Healthcare) Does Patient Have a Medical Advance Directive?: No Would patient like information on creating a medical advance directive?: No - Patient declined  Reviewed/Updated  Reviewed/Updated: Reviewed All (Medical, Surgical, Family, Medications, Allergies, Care Teams, Patient Goals)        Objective:    Today's Vitals   07/04/24 1313  BP: 134/85  Pulse: (!) 57  Temp: 98.1 F (36.7 C)  TempSrc: Oral  SpO2: 99%  Weight: 196 lb 6.4 oz (89.1 kg)  Height: 6' (1.829 m)   Body mass index is 26.64 kg/m.  Current Medications (verified) Outpatient Encounter Medications as of 07/04/2024  Medication Sig   amLODipine  (NORVASC ) 5 MG tablet TAKE ONE TABLET BY MOUTH ONCE A DAY   aspirin  EC 81 MG tablet Take 81 mg by mouth daily.    buPROPion  (WELLBUTRIN  XL) 300 MG 24 hr tablet TAKE ONE TABLET (300 MG TOTAL) BY MOUTH DAILY.   clopidogrel  (PLAVIX ) 75 MG  tablet TAKE ONE TABLET BY MOUTH ONCE A DAY   finasteride  (PROSCAR ) 5 MG tablet Take 5 mg by mouth daily.   isosorbide  mononitrate (IMDUR ) 30 MG 24 hr tablet TAKE ONE TABLET BY MOUTH ONCE A DAY   losartan  (COZAAR ) 25 MG tablet TAKE ONE TABLET BY MOUTH ONCE A DAY   metoprolol  succinate (TOPROL -XL) 50 MG 24 hr tablet TAKE ONE TABLET (50 MG TOTAL) BY MOUTH DAILY.   nitroGLYCERIN  (NITROSTAT ) 0.4 MG SL tablet Place 1 tablet (0.4 mg total) under the tongue every 5 (five) minutes as needed for chest pain. DISSOLVE ONE TABLET UNDER TONGUE AS NEEDED FOR CHEST PAIN. MAY REPEAT FIVE MINUTES APART THREE TIMES IF NEEDED   rosuvastatin  (CRESTOR ) 40 MG tablet TAKE ONE TABLET (40 MG TOTAL) BY MOUTH DAILY.   tamsulosin   (FLOMAX ) 0.4 MG CAPS capsule TAKE ONE CAPSULE BY MOUTH EVERY DAY 30 MINS FOLLOWING THE SAME MEAL EACH DAY   traZODone  (DESYREL ) 50 MG tablet TAKE ONE TABLET (50 MG TOTAL) BY MOUTH AT BEDTIME.   No facility-administered encounter medications on file as of 07/04/2024.   Hearing/Vision screen No results found. Immunizations and Health Maintenance Health Maintenance  Topic Date Due   DTaP/Tdap/Td (1 - Tdap) Never done   Lung Cancer Screening  Never done   Zoster Vaccines- Shingrix (1 of 2) Never done   COVID-19 Vaccine (4 - 2025-26 season) 07/20/2024 (Originally 04/11/2024)   Influenza Vaccine  11/08/2024 (Originally 03/11/2024)   Medicare Annual Wellness (AWV)  07/04/2025   Fecal DNA (Cologuard)  10/22/2026   Pneumococcal Vaccine: 50+ Years  Completed   Hepatitis C Screening  Completed   Meningococcal B Vaccine  Aged Out    Physical Exam Constitutional:      General: He is not in acute distress.    Appearance: Normal appearance. He is not ill-appearing.  HENT:     Mouth/Throat:     Mouth: Mucous membranes are moist.  Eyes:     General: No scleral icterus.       Right eye: No discharge.        Left eye: No discharge.     Extraocular Movements: Extraocular movements intact.     Conjunctiva/sclera: Conjunctivae normal.     Pupils: Pupils are equal, round, and reactive to light.     Comments: Senile arcus bilaterally   Cardiovascular:     Rate and Rhythm: Normal rate and regular rhythm.     Heart sounds: Normal heart sounds.  Pulmonary:     Effort: Pulmonary effort is normal.     Breath sounds: Normal breath sounds.  Abdominal:     General: Bowel sounds are normal. There is no distension.     Palpations: Abdomen is soft.     Tenderness: There is no abdominal tenderness.  Musculoskeletal:        General: No deformity or signs of injury. Normal range of motion.     Cervical back: Normal range of motion and neck supple. No tenderness.     Right lower leg: No edema.     Left lower  leg: No edema.  Lymphadenopathy:     Cervical: No cervical adenopathy.  Neurological:     Mental Status: He is alert and oriented to person, place, and time.     Cranial Nerves: No cranial nerve deficit.     Motor: No weakness.     Gait: Gait normal.  Psychiatric:        Mood and Affect: Mood normal.  Behavior: Behavior normal.        Assessment/Plan:  This is a routine wellness examination for Ryan Stafford.  Patient Care Team: Sharma Coyer, MD as PCP - General (Family Medicine)  I have personally reviewed and noted the following in the patient's chart:   Medical and social history Use of alcohol, tobacco or illicit drugs  Current medications and supplements including opioid prescriptions. Functional ability and status Nutritional status Physical activity Advanced directives List of other physicians Hospitalizations, surgeries, and ER visits in previous 12 months Vitals Screenings to include cognitive, depression, and falls Referrals and appointments  Orders Placed This Encounter  Procedures   Hemoglobin A1c   CBC   CMP14+EGFR   Lipid panel   PSA   Ambulatory Referral for Lung Cancer Screening [[id number]]    Referral Priority:   Routine    Referral Type:   Consultation    Referral Reason:   Specialty Services Required    Number of Visits Requested:   1   In addition, I have reviewed and discussed with patient certain preventive protocols, quality metrics, and best practice recommendations. A written personalized care plan for preventive services as well as general preventive health recommendations were provided to patient.   Assessment and Plan Assessment & Plan Adult Wellness Visit Annual wellness visit conducted. No recent labs available. Cologuard was negative eight months ago. Cholesterol checked in February was good. - Ordered CBC for anemia check - Ordered metabolic panel to assess kidney, liver, and electrolytes - Ordered A1c for diabetes  screening - Ordered cholesterol check  Atherosclerotic heart disease of native coronary artery with angina Scheduled for a nuclear stress test on December 19th with cardiology.  Chronic systolic heart failure Chronic condition.  Essential hypertension Chronic condition.  Mixed hyperlipidemia Cholesterol levels were good in February. - Ordered cholesterol check  Benign prostatic hyperplasia with lower urinary tract symptoms Chronic condition.  Male erectile dysfunction Chronic condition.  Nicotine dependence, cigarettes, uncomplicated History of smoking greater than forty pack-years. Currently abstinent. Recommended lung cancer screening due to smoking history. - Referred for low dose chest CT for lung cancer screening    Malayla Granberry Simmons-Robinson, MD   07/04/2024   Return in about 6 months (around 01/01/2025) for Chronic F/U.

## 2024-07-05 LAB — LIPID PANEL
Chol/HDL Ratio: 2.5 ratio (ref 0.0–5.0)
Cholesterol, Total: 152 mg/dL (ref 100–199)
HDL: 61 mg/dL (ref >39)
LDL Chol Calc (NIH): 72 mg/dL (ref 0–99)
Triglycerides: 103 mg/dL (ref 0–149)
VLDL Cholesterol Cal: 19 mg/dL (ref 5–40)

## 2024-07-05 LAB — CMP14+EGFR
ALT: 27 IU/L (ref 0–44)
AST: 24 IU/L (ref 0–40)
Albumin: 4.3 g/dL (ref 3.9–4.9)
Alkaline Phosphatase: 68 IU/L (ref 47–123)
BUN/Creatinine Ratio: 8 — ABNORMAL LOW (ref 10–24)
BUN: 10 mg/dL (ref 8–27)
Bilirubin Total: 0.9 mg/dL (ref 0.0–1.2)
CO2: 26 mmol/L (ref 20–29)
Calcium: 9.7 mg/dL (ref 8.6–10.2)
Chloride: 103 mmol/L (ref 96–106)
Creatinine, Ser: 1.31 mg/dL — ABNORMAL HIGH (ref 0.76–1.27)
Globulin, Total: 2.9 g/dL (ref 1.5–4.5)
Glucose: 79 mg/dL (ref 70–99)
Potassium: 4.6 mmol/L (ref 3.5–5.2)
Sodium: 140 mmol/L (ref 134–144)
Total Protein: 7.2 g/dL (ref 6.0–8.5)
eGFR: 59 mL/min/1.73 — ABNORMAL LOW (ref >59)

## 2024-07-05 LAB — HEMOGLOBIN A1C
Est. average glucose Bld gHb Est-mCnc: 88 mg/dL
Hgb A1c MFr Bld: 4.7 % — ABNORMAL LOW (ref 4.8–5.6)

## 2024-07-05 LAB — CBC
Hematocrit: 42.8 % (ref 37.5–51.0)
Hemoglobin: 14.1 g/dL (ref 13.0–17.7)
MCH: 32.6 pg (ref 26.6–33.0)
MCHC: 32.9 g/dL (ref 31.5–35.7)
MCV: 99 fL — ABNORMAL HIGH (ref 79–97)
Platelets: 279 x10E3/uL (ref 150–450)
RBC: 4.33 x10E6/uL (ref 4.14–5.80)
RDW: 12 % (ref 11.6–15.4)
WBC: 4.6 x10E3/uL (ref 3.4–10.8)

## 2024-07-05 LAB — PSA: Prostate Specific Ag, Serum: 2.6 ng/mL (ref 0.0–4.0)

## 2024-07-11 ENCOUNTER — Ambulatory Visit: Payer: Self-pay | Admitting: Family Medicine

## 2024-07-20 ENCOUNTER — Telehealth: Payer: Self-pay

## 2024-07-20 DIAGNOSIS — Z87891 Personal history of nicotine dependence: Secondary | ICD-10-CM

## 2024-07-20 DIAGNOSIS — Z122 Encounter for screening for malignant neoplasm of respiratory organs: Secondary | ICD-10-CM

## 2024-07-20 NOTE — Telephone Encounter (Signed)
 Lung Cancer Screening Narrative/Criteria Questionnaire (Cigarette Smokers Only- No Cigars/Pipes/vapes)   Ryan Stafford   SDMV:07/21/2024 at 1:30 pm [place]        [birth year]               LDCT: 08/24/2024 at 1:00 pm at [facility]    68 y.o.   Phone:  [phone]  Lung Screening Narrative (confirm age 57-77 yrs Medicare / 50-80 yrs Private pay insurance)   Insurance information: [employer]   Referring Provider: Simmons-Robinson   This screening involves an initial phone call with a team member from our program. It is called a shared decision making visit. The initial meeting is required by  insurance and Medicare to make sure you understand the program. This appointment takes about 15-20 minutes to complete. You will complete the screening scan at your scheduled date/time.  This scan takes about 5-10 minutes to complete. You can eat and drink normally before and after the scan.  Criteria questions for Lung Cancer Screening:   Are you a current or former smoker? Former Age began smoking: 14   If you are a former smoker, what year did you quit smoking? Quit 2015 (within 15 yrs)   To calculate your smoking history, I need an accurate estimate of how many packs of cigarettes you smoked per day and for how many years. (Not just the number of PPD you are now smoking)   Years smoking 44 x Packs per day 1 = Pack years 44   (at least 20 pack yrs)   (Make sure they understand that we need to know how much they have smoked in the past, not just the number of PPD they are smoking now)  Do you have a personal history of cancer?  No    Do you have a family history of cancer? No  Are you coughing up blood?  No  Have you had unexplained weight loss of 15 lbs or more in the last 6 months? No  It looks like you meet all criteria.  When would be a good time for us  to schedule you for this screening?   Additional information: N/A

## 2024-07-21 ENCOUNTER — Encounter

## 2024-07-21 ENCOUNTER — Encounter: Payer: Self-pay | Admitting: Emergency Medicine

## 2024-07-21 ENCOUNTER — Telehealth (INDEPENDENT_AMBULATORY_CARE_PROVIDER_SITE_OTHER)

## 2024-07-21 DIAGNOSIS — Z539 Procedure and treatment not carried out, unspecified reason: Secondary | ICD-10-CM

## 2024-07-21 NOTE — Telephone Encounter (Cosign Needed)
 Attempted X 3 to contact patient for Shared Decision Making Visit. Left voicemail to reschedule SDMV and CT scan.

## 2024-07-21 NOTE — Telephone Encounter (Signed)
 SDMV listed as No-Show and LDCT has been cancelled. Mychart letter sent to patient and referral updated.

## 2024-07-28 ENCOUNTER — Encounter

## 2024-07-28 DIAGNOSIS — I340 Nonrheumatic mitral (valve) insufficiency: Secondary | ICD-10-CM

## 2024-07-28 DIAGNOSIS — I25118 Atherosclerotic heart disease of native coronary artery with other forms of angina pectoris: Secondary | ICD-10-CM

## 2024-07-28 DIAGNOSIS — I10 Essential (primary) hypertension: Secondary | ICD-10-CM

## 2024-07-28 DIAGNOSIS — I2119 ST elevation (STEMI) myocardial infarction involving other coronary artery of inferior wall: Secondary | ICD-10-CM

## 2024-07-28 DIAGNOSIS — E782 Mixed hyperlipidemia: Secondary | ICD-10-CM

## 2024-07-29 ENCOUNTER — Other Ambulatory Visit

## 2024-07-29 DIAGNOSIS — I10 Essential (primary) hypertension: Secondary | ICD-10-CM

## 2024-07-29 DIAGNOSIS — I340 Nonrheumatic mitral (valve) insufficiency: Secondary | ICD-10-CM

## 2024-07-29 DIAGNOSIS — I361 Nonrheumatic tricuspid (valve) insufficiency: Secondary | ICD-10-CM | POA: Diagnosis not present

## 2024-07-29 DIAGNOSIS — E782 Mixed hyperlipidemia: Secondary | ICD-10-CM

## 2024-07-29 DIAGNOSIS — I371 Nonrheumatic pulmonary valve insufficiency: Secondary | ICD-10-CM | POA: Diagnosis not present

## 2024-07-29 DIAGNOSIS — I2119 ST elevation (STEMI) myocardial infarction involving other coronary artery of inferior wall: Secondary | ICD-10-CM

## 2024-07-29 DIAGNOSIS — I25118 Atherosclerotic heart disease of native coronary artery with other forms of angina pectoris: Secondary | ICD-10-CM

## 2024-07-29 DIAGNOSIS — I351 Nonrheumatic aortic (valve) insufficiency: Secondary | ICD-10-CM

## 2024-08-01 ENCOUNTER — Ambulatory Visit: Admitting: Cardiovascular Disease

## 2024-08-01 ENCOUNTER — Encounter: Payer: Self-pay | Admitting: Cardiovascular Disease

## 2024-08-01 VITALS — BP 152/90 | HR 68 | Ht 72.0 in | Wt 198.6 lb

## 2024-08-01 DIAGNOSIS — I5042 Chronic combined systolic (congestive) and diastolic (congestive) heart failure: Secondary | ICD-10-CM

## 2024-08-01 DIAGNOSIS — I10 Essential (primary) hypertension: Secondary | ICD-10-CM

## 2024-08-01 DIAGNOSIS — I340 Nonrheumatic mitral (valve) insufficiency: Secondary | ICD-10-CM

## 2024-08-01 DIAGNOSIS — Z131 Encounter for screening for diabetes mellitus: Secondary | ICD-10-CM

## 2024-08-01 DIAGNOSIS — R0602 Shortness of breath: Secondary | ICD-10-CM | POA: Diagnosis not present

## 2024-08-01 DIAGNOSIS — I25118 Atherosclerotic heart disease of native coronary artery with other forms of angina pectoris: Secondary | ICD-10-CM

## 2024-08-01 DIAGNOSIS — I2119 ST elevation (STEMI) myocardial infarction involving other coronary artery of inferior wall: Secondary | ICD-10-CM | POA: Diagnosis not present

## 2024-08-01 DIAGNOSIS — E782 Mixed hyperlipidemia: Secondary | ICD-10-CM | POA: Diagnosis not present

## 2024-08-01 MED ORDER — EMPAGLIFLOZIN 25 MG PO TABS: 25.0000 mg | ORAL_TABLET | Freq: Every day | ORAL | 2 refills | Status: DC

## 2024-08-01 MED ORDER — SPIRONOLACTONE 25 MG PO TABS: 12.5000 mg | ORAL_TABLET | Freq: Every day | ORAL | 11 refills | Status: AC

## 2024-08-01 NOTE — Progress Notes (Signed)
 "     Cardiology Office Note   Date:  08/01/2024   ID:  [name], DOB [birth year], MRN [id number]  PCP:  Sharma Coyer, MD  Cardiologist:  Denyse Bathe, MD      History of Present Illness: Ryan Stafford is a 68 y.o. male who presents for  Chief Complaint  Patient presents with   Follow-up    NST & Echo results    No complaints, no chest pain or SOB.      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   Overview: WITH LIMA TO THE DISTAL LAD, SVD TO RAMUS, SVG TO PL AND SVG TO PDA AT [facility]   S/P cardiac catheterization 12/12/2011   Overview: 3 VESSEL CAD     Past Surgical History:  Procedure Laterality Date   CARDIAC CATHETERIZATION N/A 04/20/2015   Procedure: Left Heart Cath and Cors/Grafts Angiography;  Surgeon: Marsa Dooms, MD;  Location: [facility] INVASIVE CV [facility];  Service: Cardiovascular;  Laterality: N/A;   CORONARY ARTERY BYPASS GRAFT     CORONARY/GRAFT ACUTE MI REVASCULARIZATION N/A 01/03/2019   Procedure: GRAFT ANGIOGRAPHY;  Surgeon: Darron Deatrice LABOR, MD;  Location: [facility] CV [facility];  Service: Cardiovascular;  Laterality: N/A;   LEFT HEART CATH AND CORONARY ANGIOGRAPHY N/A 01/03/2019   Procedure: LEFT HEART CATH AND CORONARY ANGIOGRAPHY;  Surgeon: Darron Deatrice LABOR, MD;  Location: [facility] INVAS[facility] CV [facility];  Service: Cardiovascular;  Laterality: N/A;     Current Outpatient Medications  Medication Sig Dispense Refill   amLODipine  (NORVASC ) 5 MG tablet TAKE ONE TABLET BY MOUTH ONCE A DAY 90 tablet 3   aspirin  EC 81 MG tablet Take 81 mg by mouth daily.      buPROPion  (WELLBUTRIN  XL) 300 MG 24 hr tablet TAKE  ONE TABLET (300 MG TOTAL) BY MOUTH DAILY. 90 tablet 1   clopidogrel  (PLAVIX ) 75 MG tablet TAKE ONE TABLET BY MOUTH ONCE A DAY 90 tablet 0   empagliflozin  (JARDIANCE ) 25 MG TABS tablet Take 1 tablet (25 mg total) by mouth daily before breakfast. 30 tablet 2   finasteride  (PROSCAR ) 5 MG tablet Take 5 mg by mouth daily.     isosorbide  mononitrate (IMDUR ) 30 MG 24 hr tablet TAKE ONE TABLET BY MOUTH ONCE A DAY 30 tablet 5   losartan  (COZAAR ) 25 MG tablet TAKE ONE TABLET BY MOUTH ONCE A DAY 30 tablet 3   metoprolol  succinate (TOPROL -XL) 50 MG 24 hr tablet TAKE ONE TABLET (50 MG TOTAL) BY MOUTH DAILY. 90 tablet 1   nitroGLYCERIN  (NITROSTAT ) 0.4 MG SL tablet Place 1 tablet (0.4 mg total) under the tongue every 5 (five) minutes as needed for chest pain. DISSOLVE ONE TABLET UNDER TONGUE AS NEEDED FOR CHEST PAIN. MAY REPEAT FIVE MINUTES APART THREE TIMES IF NEEDED 50 tablet 2   rosuvastatin  (CRESTOR ) 40 MG tablet TAKE ONE TABLET (40 MG TOTAL) BY MOUTH DAILY. 90 tablet 3   spironolactone  (ALDACTONE ) 25 MG tablet Take 0.5 tablets (12.5 mg total) by mouth daily. 30 tablet 11   tamsulosin  (FLOMAX ) 0.4 MG CAPS capsule TAKE ONE CAPSULE BY MOUTH EVERY DAY 30 MINS FOLLOWING  THE SAME MEAL EACH DAY 30 capsule 3   traZODone  (DESYREL ) 50 MG tablet TAKE ONE TABLET (50 MG TOTAL) BY MOUTH AT BEDTIME. 30 tablet 2   No current facility-administered medications for this visit.    Allergies:   Patient has no known allergies.    Social History:   reports that he quit smoking about 13 years ago. His smoking use included cigarettes. He started smoking about 56 years ago. He has a 64.5 pack-year smoking history. He has quit using smokeless tobacco. He reports that he does not currently use alcohol. He reports that he does not use drugs.   Family History:  family history includes Hypertension in his brother and father.    ROS:     Review of Systems  Constitutional: Negative.   HENT: Negative.    Eyes: Negative.    Respiratory: Negative.    Gastrointestinal: Negative.   Genitourinary: Negative.   Musculoskeletal: Negative.   Skin: Negative.   Neurological: Negative.   Endo/Heme/Allergies: Negative.   Psychiatric/Behavioral: Negative.    All other systems reviewed and are negative.     All other systems are reviewed and negative.    PHYSICAL EXAM: VS:  BP (!) 152/90   Pulse 68   Ht 6' (1.829 m)   Wt 198 lb 9.6 oz (90.1 kg)   SpO2 95%   BMI 26.94 kg/m  , BMI Body mass index is 26.94 kg/m. Last weight:  Wt Readings from Last 3 Encounters:  08/01/24 198 lb 9.6 oz (90.1 kg)  07/04/24 196 lb 6.4 oz (89.1 kg)  07/01/24 196 lb (88.9 kg)     Physical Exam Vitals reviewed.  Constitutional:      Appearance: Normal appearance. He is normal weight.  HENT:     Head: Normocephalic.     Nose: Nose normal.     Mouth/Throat:     Mouth: Mucous membranes are moist.  Eyes:     Pupils: Pupils are equal, round, and reactive to light.  Cardiovascular:     Rate and Rhythm: Normal rate and regular rhythm.     Pulses: Normal pulses.     Heart sounds: Normal heart sounds.  Pulmonary:     Effort: Pulmonary effort is normal.  Abdominal:     General: Abdomen is flat. Bowel sounds are normal.  Musculoskeletal:        General: Normal range of motion.     Cervical back: Normal range of motion.  Skin:    General: Skin is warm.  Neurological:     General: No focal deficit present.     Mental Status: He is alert.  Psychiatric:        Mood and Affect: Mood normal.       EKG:   Recent Labs: 07/04/2024: ALT 27; BUN 10; Creatinine, Ser 1.31; Hemoglobin 14.1; Platelets 279; Potassium 4.6; Sodium 140    Lipid Panel    Component Value Date/Time   CHOL 152 07/04/2024 1357   CHOL 166 12/13/2011 0536   TRIG 103 07/04/2024 1357   TRIG 115 12/13/2011 0536   HDL 61 07/04/2024 1357   HDL 39 (L) 12/13/2011 0536   CHOLHDL 2.5 07/04/2024 1357   VLDL 23 12/13/2011 0536   LDLCALC 72 07/04/2024 1357    LDLCALC 104 (H) 12/13/2011 0536      Other studies Reviewed: Additional studies/ records that were reviewed today include:  Review of the above records demonstrates:       No data to display  ASSESSMENT AND PLAN:    ICD-10-CM   1. Coronary artery disease of native heart with stable angina pectoris, unspecified vessel or lesion type  I25.118 empagliflozin  (JARDIANCE ) 25 MG TABS tablet    spironolactone  (ALDACTONE ) 25 MG tablet    2. Primary hypertension  I10 empagliflozin  (JARDIANCE ) 25 MG TABS tablet    spironolactone  (ALDACTONE ) 25 MG tablet    3. Mixed hyperlipidemia  E78.2 empagliflozin  (JARDIANCE ) 25 MG TABS tablet    spironolactone  (ALDACTONE ) 25 MG tablet    4. Acute ST elevation myocardial infarction (STEMI) of inferolateral wall (HCC)  I21.19 empagliflozin  (JARDIANCE ) 25 MG TABS tablet    spironolactone  (ALDACTONE ) 25 MG tablet    5. Nonrheumatic mitral valve regurgitation  I34.0 empagliflozin  (JARDIANCE ) 25 MG TABS tablet    spironolactone  (ALDACTONE ) 25 MG tablet    6. SOB (shortness of breath)  R06.02 empagliflozin  (JARDIANCE ) 25 MG TABS tablet    spironolactone  (ALDACTONE ) 25 MG tablet    7. Chronic combined systolic and diastolic congestive heart failure (HCC)  I50.42 empagliflozin  (JARDIANCE ) 25 MG TABS tablet    spironolactone  (ALDACTONE ) 25 MG tablet   LVEF 40%, on echo with disatlic dyfunction. Mild MR, AR, mopderate PR, mild to mod TR. Advise GDMT, jaurdiance, and aldactone .       Problem List Items Addressed This Visit       Cardiovascular and Mediastinum   Coronary artery disease - Primary   Relevant Medications   empagliflozin  (JARDIANCE ) 25 MG TABS tablet   spironolactone  (ALDACTONE ) 25 MG tablet   Primary hypertension   Relevant Medications   empagliflozin  (JARDIANCE ) 25 MG TABS tablet   spironolactone  (ALDACTONE ) 25 MG tablet   CHF (congestive heart failure) (HCC)   Relevant Medications   empagliflozin  (JARDIANCE ) 25 MG  TABS tablet   spironolactone  (ALDACTONE ) 25 MG tablet     Other   Hyperlipidemia   Relevant Medications   empagliflozin  (JARDIANCE ) 25 MG TABS tablet   spironolactone  (ALDACTONE ) 25 MG tablet   Other Visit Diagnoses       Acute ST elevation myocardial infarction (STEMI) of inferolateral wall (HCC)       Relevant Medications   empagliflozin  (JARDIANCE ) 25 MG TABS tablet   spironolactone  (ALDACTONE ) 25 MG tablet     Nonrheumatic mitral valve regurgitation       Relevant Medications   empagliflozin  (JARDIANCE ) 25 MG TABS tablet   spironolactone  (ALDACTONE ) 25 MG tablet     SOB (shortness of breath)       Relevant Medications   empagliflozin  (JARDIANCE ) 25 MG TABS tablet   spironolactone  (ALDACTONE ) 25 MG tablet          Disposition:   Return in about 6 weeks (around 09/12/2024).    Total time spent: 35 minutes  Signed,  Denyse Bathe, MD  08/01/2024 2:07 PM    [facility] "

## 2024-08-03 ENCOUNTER — Other Ambulatory Visit: Payer: Self-pay | Admitting: Family Medicine

## 2024-08-24 ENCOUNTER — Ambulatory Visit

## 2024-09-07 ENCOUNTER — Other Ambulatory Visit: Payer: Self-pay | Admitting: Family Medicine

## 2024-09-12 ENCOUNTER — Ambulatory Visit: Admitting: Cardiovascular Disease

## 2024-09-14 NOTE — Progress Notes (Signed)
 Ryan Stafford                                          MRN: [id number]   09/14/2024   The [facility] Quality Team Specialist reviewed this patient medical record for the purposes of chart review for care gap closure. The following were reviewed: chart review for care gap closure-controlling blood pressure.    [facility] Quality Team

## 2024-09-16 ENCOUNTER — Ambulatory Visit: Admitting: Cardiovascular Disease

## 2024-09-16 ENCOUNTER — Encounter: Payer: Self-pay | Admitting: Cardiovascular Disease

## 2024-09-16 VITALS — BP 142/84 | HR 62 | Ht 72.0 in | Wt 201.0 lb

## 2024-09-16 DIAGNOSIS — I5042 Chronic combined systolic (congestive) and diastolic (congestive) heart failure: Secondary | ICD-10-CM

## 2024-09-16 DIAGNOSIS — I340 Nonrheumatic mitral (valve) insufficiency: Secondary | ICD-10-CM

## 2024-09-16 DIAGNOSIS — I10 Essential (primary) hypertension: Secondary | ICD-10-CM

## 2024-09-16 DIAGNOSIS — E782 Mixed hyperlipidemia: Secondary | ICD-10-CM

## 2024-09-16 DIAGNOSIS — R0602 Shortness of breath: Secondary | ICD-10-CM

## 2024-09-16 DIAGNOSIS — I25118 Atherosclerotic heart disease of native coronary artery with other forms of angina pectoris: Secondary | ICD-10-CM

## 2024-09-16 DIAGNOSIS — I2119 ST elevation (STEMI) myocardial infarction involving other coronary artery of inferior wall: Secondary | ICD-10-CM

## 2024-09-16 MED ORDER — EMPAGLIFLOZIN 25 MG PO TABS: 25.0000 mg | ORAL_TABLET | Freq: Every day | ORAL | 2 refills | Status: AC

## 2024-09-16 MED ORDER — LOSARTAN POTASSIUM 50 MG PO TABS: 50.0000 mg | ORAL_TABLET | Freq: Every day | ORAL | 11 refills | Status: AC

## 2024-09-16 NOTE — Progress Notes (Signed)
 "     Cardiology Office Note   Date:  09/16/2024   ID:  Ryan Stafford, DOB 01/29/1956, MRN [id number]  PCP:  Sharma Coyer, MD  Cardiologist:  Denyse Bathe, MD      History of Present Illness: Ryan Stafford is a 69 y.o. male who presents for  Chief Complaint  Patient presents with   Follow-up    6 week follow-up     Took blood pressure just prior to coming.      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   Overview: WITH LIMA TO THE DISTAL LAD, SVD TO RAMUS, SVG TO PL AND SVG TO PDA AT [facility]   S/P cardiac catheterization 12/12/2011   Overview: 3 VESSEL CAD     Past Surgical History:  Procedure Laterality Date   CARDIAC CATHETERIZATION N/A 04/20/2015   Procedure: Left Heart Cath and Cors/Grafts Angiography;  Surgeon: Marsa Dooms, MD;  Location: [facility];  Service: Cardiovascular;  Laterality: N/A;   CORONARY ARTERY BYPASS GRAFT     CORONARY/GRAFT ACUTE MI REVASCULARIZATION N/A 01/03/2019   Procedure: GRAFT ANGIOGRAPHY;  Surgeon: Darron Deatrice LABOR, MD;  Location: [facility];  Service: Cardiovascular;  Laterality: N/A;   LEFT HEART CATH AND CORONARY ANGIOGRAPHY N/A 01/03/2019   Procedure: LEFT HEART CATH AND CORONARY ANGIOGRAPHY;  Surgeon: Darron Deatrice LABOR, MD;  Location: [facility];  Service: Cardiovascular;  Laterality: N/A;     Current Outpatient Medications  Medication Sig Dispense Refill   amLODipine  (NORVASC ) 5 MG tablet TAKE ONE TABLET BY MOUTH ONCE A DAY 90 tablet 3   aspirin  EC 81 MG tablet Take 81 mg by mouth daily.      buPROPion  (WELLBUTRIN  XL) 300 MG 24 hr tablet TAKE  ONE TABLET (300 MG TOTAL) BY MOUTH DAILY. 90 tablet 1   clopidogrel  (PLAVIX ) 75 MG tablet TAKE ONE TABLET BY MOUTH ONCE A DAY 90 tablet 0   finasteride  (PROSCAR ) 5 MG tablet Take 5 mg by mouth daily.     isosorbide  mononitrate (IMDUR ) 30 MG 24 hr tablet TAKE ONE TABLET BY MOUTH ONCE A DAY 30 tablet 5   losartan  (COZAAR ) 50 MG tablet Take 1 tablet (50 mg total) by mouth daily. 30 tablet 11   metoprolol  succinate (TOPROL -XL) 50 MG 24 hr tablet TAKE ONE TABLET (50 MG TOTAL) BY MOUTH DAILY. 90 tablet 1   nitroGLYCERIN  (NITROSTAT ) 0.4 MG SL tablet Place 1 tablet (0.4 mg total) under the tongue every 5 (five) minutes as needed for chest pain. DISSOLVE ONE TABLET UNDER TONGUE AS NEEDED FOR CHEST PAIN. MAY REPEAT FIVE MINUTES APART THREE TIMES IF NEEDED 50 tablet 2   rosuvastatin  (CRESTOR ) 40 MG tablet TAKE ONE TABLET (40 MG TOTAL) BY MOUTH DAILY. 90 tablet 3   spironolactone  (ALDACTONE ) 25 MG tablet Take 0.5 tablets (12.5 mg total) by mouth daily. 30 tablet 11   tamsulosin  (FLOMAX ) 0.4 MG CAPS capsule TAKE ONE CAPSULE BY MOUTH EVERY DAY 30 MINS FOLLOWING THE SAME MEAL EACH DAY 30 capsule 3   traZODone  (DESYREL ) 50 MG tablet TAKE ONE TABLET (50 MG TOTAL)  BY MOUTH AT BEDTIME. 30 tablet 2   empagliflozin  (JARDIANCE ) 25 MG TABS tablet Take 1 tablet (25 mg total) by mouth daily before breakfast. 30 tablet 2   No current facility-administered medications for this visit.    Allergies:   Patient has no known allergies.    Social History:   reports that he quit smoking about 13 years ago. His smoking use included cigarettes. He started smoking about 56 years ago. He has a 64.5 pack-year smoking history. He has quit using smokeless tobacco. He reports that he does not currently use alcohol. He reports that he does not use drugs.   Family History:  family history includes Hypertension in his brother and father.    ROS:     Review of Systems  Constitutional: Negative.   HENT: Negative.    Eyes: Negative.    Respiratory: Negative.    Gastrointestinal: Negative.   Genitourinary: Negative.   Musculoskeletal: Negative.   Skin: Negative.   Neurological: Negative.   Endo/Heme/Allergies: Negative.   Psychiatric/Behavioral: Negative.    All other systems reviewed and are negative.     All other systems are reviewed and negative.    PHYSICAL EXAM: VS:  BP (!) 142/84   Pulse 62   Ht 6' (1.829 m)   Wt 201 lb (91.2 kg)   SpO2 98%   BMI 27.26 kg/m  , BMI Body mass index is 27.26 kg/m. Last weight:  Wt Readings from Last 3 Encounters:  09/16/24 201 lb (91.2 kg)  08/01/24 198 lb 9.6 oz (90.1 kg)  07/04/24 196 lb 6.4 oz (89.1 kg)     Physical Exam Vitals reviewed.  Constitutional:      Appearance: Normal appearance. He is normal weight.  HENT:     Head: Normocephalic.     Nose: Nose normal.     Mouth/Throat:     Mouth: Mucous membranes are moist.  Eyes:     Pupils: Pupils are equal, round, and reactive to light.  Cardiovascular:     Rate and Rhythm: Normal rate and regular rhythm.     Pulses: Normal pulses.     Heart sounds: Normal heart sounds.  Pulmonary:     Effort: Pulmonary effort is normal.  Abdominal:     General: Abdomen is flat. Bowel sounds are normal.  Musculoskeletal:        General: Normal range of motion.     Cervical back: Normal range of motion.  Skin:    General: Skin is warm.  Neurological:     General: No focal deficit present.     Mental Status: He is alert.  Psychiatric:        Mood and Affect: Mood normal.       EKG:   Recent Labs: 07/04/2024: ALT 27; BUN 10; Creatinine, Ser 1.31; Hemoglobin 14.1; Platelets 279; Potassium 4.6; Sodium 140    Lipid Panel    Component Value Date/Time   CHOL 152 07/04/2024 1357   CHOL 166 12/13/2011 0536   TRIG 103 07/04/2024 1357   TRIG 115 12/13/2011 0536   HDL 61 07/04/2024 1357   HDL 39 (L) 12/13/2011 0536   CHOLHDL 2.5 07/04/2024 1357   VLDL 23 12/13/2011 0536   LDLCALC 72 07/04/2024 1357    LDLCALC 104 (H) 12/13/2011 0536      Other studies Reviewed: Additional studies/ records that were reviewed today include:  Review of the above records demonstrates:       No data to display  ASSESSMENT AND PLAN:    ICD-10-CM   1. Coronary artery disease of native heart with stable angina pectoris, unspecified vessel or lesion type  I25.118 losartan  (COZAAR ) 50 MG tablet    empagliflozin  (JARDIANCE ) 25 MG TABS tablet    2. Primary hypertension  I10 losartan  (COZAAR ) 50 MG tablet    empagliflozin  (JARDIANCE ) 25 MG TABS tablet    3. Mixed hyperlipidemia  E78.2 losartan  (COZAAR ) 50 MG tablet    empagliflozin  (JARDIANCE ) 25 MG TABS tablet    4. Acute ST elevation myocardial infarction (STEMI) of inferolateral wall (HCC)  I21.19 losartan  (COZAAR ) 50 MG tablet    empagliflozin  (JARDIANCE ) 25 MG TABS tablet    5. Nonrheumatic mitral valve regurgitation  I34.0 losartan  (COZAAR ) 50 MG tablet    empagliflozin  (JARDIANCE ) 25 MG TABS tablet    6. SOB (shortness of breath)  R06.02 losartan  (COZAAR ) 50 MG tablet    empagliflozin  (JARDIANCE ) 25 MG TABS tablet    7. Chronic combined systolic and diastolic congestive heart failure (HCC)  I50.42 losartan  (COZAAR ) 50 MG tablet    empagliflozin  (JARDIANCE ) 25 MG TABS tablet   LVEf 40%, mild MR/AR, moid TR, pulm HTN. Increase losartan  as BP high.    8. Chronic combined systolic and diastolic congestive heart failure (HCC)  I50.42 losartan  (COZAAR ) 50 MG tablet    empagliflozin  (JARDIANCE ) 25 MG TABS tablet   LVEF 40%, on echo with disatlic dyfunction. Mild MR, AR, mopderate PR, mild to mod TR. Advise GDMT, jaurdiance, and aldactone .       Problem List Items Addressed This Visit       Cardiovascular and Mediastinum   Coronary artery disease - Primary   Relevant Medications   losartan  (COZAAR ) 50 MG tablet   empagliflozin  (JARDIANCE ) 25 MG TABS tablet   Primary hypertension   Relevant Medications   losartan  (COZAAR ) 50 MG  tablet   empagliflozin  (JARDIANCE ) 25 MG TABS tablet   CHF (congestive heart failure) (HCC)   Relevant Medications   losartan  (COZAAR ) 50 MG tablet   empagliflozin  (JARDIANCE ) 25 MG TABS tablet     Other   Hyperlipidemia   Relevant Medications   losartan  (COZAAR ) 50 MG tablet   empagliflozin  (JARDIANCE ) 25 MG TABS tablet   Other Visit Diagnoses       Acute ST elevation myocardial infarction (STEMI) of inferolateral wall (HCC)       Relevant Medications   losartan  (COZAAR ) 50 MG tablet   empagliflozin  (JARDIANCE ) 25 MG TABS tablet     Nonrheumatic mitral valve regurgitation       Relevant Medications   losartan  (COZAAR ) 50 MG tablet   empagliflozin  (JARDIANCE ) 25 MG TABS tablet     SOB (shortness of breath)       Relevant Medications   losartan  (COZAAR ) 50 MG tablet   empagliflozin  (JARDIANCE ) 25 MG TABS tablet          Disposition:   Return in about 2 months (around 11/14/2024).    Total time spent: 30 minutes  Signed,  Denyse Bathe, MD  09/16/2024 11:09 AM    [facility] "

## 2024-12-02 ENCOUNTER — Ambulatory Visit: Admitting: Cardiovascular Disease

## 2025-01-03 ENCOUNTER — Ambulatory Visit: Admitting: Family Medicine

## 2025-01-09 ENCOUNTER — Ambulatory Visit: Admitting: Family Medicine
